# Patient Record
Sex: Female | Born: 1995 | Race: White | Hispanic: No | Marital: Single | Smoking: Never smoker
Health system: Southern US, Community
[De-identification: ages and names within clinical notes are randomized; demographics above are authoritative.]

## PROBLEM LIST (undated history)

## (undated) DIAGNOSIS — J309 Allergic rhinitis, unspecified: Secondary | ICD-10-CM

## (undated) HISTORY — DX: Allergic rhinitis, unspecified: J30.9

## (undated) HISTORY — PX: CARDIAC SURGERY: SHX584

---
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
## 2016-02-23 ENCOUNTER — Ambulatory Visit (INDEPENDENT_AMBULATORY_CARE_PROVIDER_SITE_OTHER): Payer: No Typology Code available for payment source | Admitting: Allergy and Immunology

## 2016-02-23 ENCOUNTER — Encounter: Payer: Self-pay | Admitting: Allergy and Immunology

## 2016-02-23 VITALS — BP 110/70 | HR 70 | Temp 98.0°F | Resp 16 | Ht 58.27 in | Wt 152.6 lb

## 2016-02-23 DIAGNOSIS — J3089 Other allergic rhinitis: Secondary | ICD-10-CM

## 2016-02-23 DIAGNOSIS — R059 Cough, unspecified: Secondary | ICD-10-CM | POA: Insufficient documentation

## 2016-02-23 DIAGNOSIS — R05 Cough: Secondary | ICD-10-CM

## 2016-02-23 DIAGNOSIS — H1045 Other chronic allergic conjunctivitis: Secondary | ICD-10-CM | POA: Diagnosis not present

## 2016-02-23 DIAGNOSIS — H1010 Acute atopic conjunctivitis, unspecified eye: Secondary | ICD-10-CM | POA: Insufficient documentation

## 2016-02-23 MED ORDER — OLOPATADINE HCL 0.7 % OP SOLN: 1.0000 [drp] | Freq: Every day | OPHTHALMIC | Status: AC | PRN

## 2016-02-23 MED ORDER — EPINEPHRINE 0.3 MG/0.3ML IJ SOAJ: INTRAMUSCULAR | Status: DC

## 2016-02-23 MED ORDER — AZELASTINE-FLUTICASONE 137-50 MCG/ACT NA SUSP: NASAL | Status: DC

## 2016-02-23 MED ORDER — LEVOCETIRIZINE DIHYDROCHLORIDE 5 MG PO TABS: ORAL_TABLET | ORAL | Status: DC

## 2016-02-23 NOTE — Progress Notes (Signed)
New Patient Note  RE: Brenda Figueroa MRN: [id number] DOB: 1996-02-23 Date of Office Visit: 02/23/2016  Referring provider: Kristie Cowman, MD Primary care provider: Kristie Cowman, MD  Chief Complaint: Allergic Rhinitis  and Sinus Problem   History of present illness: HPI Comments: Brenda Figueroa is a 20 y.o. female who presents today for consultation of rhinitis.  She lives at home with her mother and stepfather but is accompanied today by her grandmother who assists with a history.  She has a history of allergic rhinosinusitis which was treated with aeroallergen immunotherapy approximately 10-15 years ago.  It is unclear if she ever made it to maintenance therapy.  She complains of frequent rhinorrhea, nasal congestion, sneezing, postnasal drainage, coughing, sore throat, ocular pruritus, and occasional sinus pressure.  These symptoms occur year round but tend to be more frequent and severe with pollen exposure.  She had one or 2 sinus infections requiring antibiotics over the past year.  She currently takes fluticasone nasal spray, cetirizine, and diphenhydramine without adequate symptom relief.  The cough that she experiences originates as a tickle in the base of her throat.    Assessment and plan: Seasonal and perennial allergic rhinitis  Aeroallergen avoidance measures have been discussed and provided in written form.  A prescription has been provided for levocetirizine, 5 mg daily as needed.  A prescription has been provided for Dymista (azelastine/fluticasone) nasal spray, 1-2 sprays per nostril twice daily as needed. Proper nasal spray technique has been discussed and demonstrated.  I have also recommended nasal saline spray (i.e. Simply Saline) as needed prior to medicated nasal sprays.  The risks and benefits of aeroallergen immunotherapy have been discussed. The patient and her caregiver are motivated to initiate immunotherapy to reduce symptoms and decrease medication  requirement. Informed consent has been signed and allergen vaccine orders have been submitted. Medications will be decreased or discontinued as symptom relief from immunotherapy becomes evident.  Seasonal allergic conjunctivitis  Treatment plan as outlined above for allergic rhinitis.  A prescription has been provided for Pazeo, one drop per eye daily as needed.  Cough The patient's history and physical examination suggest upper airway cough syndrome.  We will aggressively treat postnasal drainage and evaluate results.  Treatment plan as outlined above.  If the coughing persists or progresses despite this plan, we will evaluate further.    Meds ordered this encounter  Medications  . levocetirizine (XYZAL) 5 MG tablet    Sig: TAKE ONE TABLET ONCE DAILY IF NEEDED    Dispense:  30 tablet    Refill:  5  . Azelastine-Fluticasone (DYMISTA) 137-50 MCG/ACT SUSP    Sig: USE 1-2 SPRAYS IN EACH NOSTRIL TWICE DAILY    Dispense:  1 Bottle    Refill:  5  . Olopatadine HCl (PAZEO) 0.7 % SOLN    Sig: Place 1 drop into both eyes daily as needed.    Dispense:  1 Bottle    Refill:  5  . EPINEPHrine (EPIPEN 2-PAK) 0.3 mg/0.3 mL IJ SOAJ injection    Sig: USE AS DIRECTED FOR LIFE THREATENING ALLERGIC REACTIONS    Dispense:  2 Device    Refill:  2    Diagnositics: Epicutaneous testing: Positive to grass pollen, weed pollen, ragweed pollen, tree pollen, molds, cat hair, dust mite antigen. Intradermal testing: Positive to molds and dog epithelia.    Physical examination: Blood pressure 110/70, pulse 70, temperature 98 F (36.7 C), temperature source Oral, resp. rate 16, height 4' 10.27" (1.48 m), weight  152 lb 8.9 oz (69.2 kg).  General: Alert, interactive, in no acute distress. HEENT: TMs pearly gray, turbinates edematous and pale with thick discharge, post-pharynx moderately erythematous. Neck: Supple without lymphadenopathy. Lungs: Clear to auscultation without wheezing, rhonchi or  rales. CV: Normal S1, S2 without murmurs. Abdomen: Nondistended, nontender. Skin: Warm and dry, without lesions or rashes. Extremities:  No clubbing, cyanosis or edema. Neuro:   Grossly intact.  Review of systems:  Review of Systems  Constitutional: Negative for fever, chills and weight loss.  HENT: Positive for congestion. Negative for nosebleeds.   Eyes: Negative for blurred vision.  Respiratory: Positive for cough. Negative for hemoptysis, shortness of breath and wheezing.   Cardiovascular: Negative for chest pain.  Gastrointestinal: Negative for diarrhea and constipation.  Genitourinary: Negative for dysuria.  Musculoskeletal: Negative for myalgias and joint pain.  Skin: Negative for itching and rash.  Neurological: Positive for headaches. Negative for dizziness.  Endo/Heme/Allergies: Positive for environmental allergies. Does not bruise/bleed easily.    Past medical history:  Past Medical History  Diagnosis Date  . Allergic rhinitis 2000    Past surgical history:  Past Surgical History  Procedure Laterality Date  . Cardiac surgery Bilateral     Family history: Family History  Problem Relation Age of Onset  . Allergic rhinitis Mother   . Allergic rhinitis Maternal Grandmother   . Angioedema Neg Hx   . Asthma Neg Hx   . Atopy Neg Hx   . Eczema Neg Hx   . Immunodeficiency Neg Hx   . Urticaria Neg Hx     Social history: Social History   Social History  . Marital Status: Single    Spouse Name: N/A  . Number of Children: N/A  . Years of Education: N/A   Occupational History  . Not on file.   Social History Main Topics  . Smoking status: Never Smoker   . Smokeless tobacco: Not on file  . Alcohol Use: No  . Drug Use: No  . Sexual Activity: Not on file   Other Topics Concern  . Not on file   Social History Narrative  . No narrative on file   Environmental History: he patient lives in a carpeting in the bedroom, gas heat, and central air.  There are  dogs in the house which have access to her bedroom. She is a non-smoker and not exposed to significant secondhand smoke.    Medication List       This list is accurate as of: 02/23/16  1:19 PM.  Always use your most recent med list.               Azelastine-Fluticasone 137-50 MCG/ACT Susp  Commonly known as:  DYMISTA  USE 1-2 SPRAYS IN EACH NOSTRIL TWICE DAILY     cetirizine 10 MG tablet  Commonly known as:  ZYRTEC  Take by mouth.     EPINEPHrine 0.3 mg/0.3 mL Soaj injection  Commonly known as:  EPIPEN 2-PAK  USE AS DIRECTED FOR LIFE THREATENING ALLERGIC REACTIONS     fluticasone 50 MCG/ACT nasal spray  Commonly known as:  FLONASE     fluticasone 50 MCG/ACT nasal spray  Commonly known as:  FLONASE  Place 2 sprays into both nostrils daily. Reported on 02/23/2016     levocetirizine 5 MG tablet  Commonly known as:  XYZAL  TAKE ONE TABLET ONCE DAILY IF NEEDED     levothyroxine 100 MCG tablet  Commonly known as:  SYNTHROID, LEVOTHROID  Reported on 02/23/2016  levothyroxine 100 MCG tablet  Commonly known as:  SYNTHROID, LEVOTHROID     multivitamin capsule  Take by mouth.     multivitamin with minerals tablet  Take by mouth. Reported on 02/23/2016     Olopatadine HCl 0.7 % Soln  Commonly known as:  PAZEO  Place 1 drop into both eyes daily as needed.     pyridOXINE 25 MG tablet  Commonly known as:  VITAMIN B-6  Take by mouth. Reported on 02/23/2016     sodium chloride 0.65 % nasal spray  Commonly known as:  OCEAN  Place into the nose.        Known medication allergies: Allergies  Allergen Reactions  . Doxycycline Rash    I appreciate the opportunity to take part in this Shadie's care. Please do not hesitate to contact me with questions.  Sincerely,   R. Jorene Guest, MD

## 2016-02-23 NOTE — Assessment & Plan Note (Signed)
   Aeroallergen avoidance measures have been discussed and provided in written form.  A prescription has been provided for levocetirizine, 5 mg daily as needed.  A prescription has been provided for Dymista (azelastine/fluticasone) nasal spray, 1-2 sprays per nostril twice daily as needed. Proper nasal spray technique has been discussed and demonstrated.  I have also recommended nasal saline spray (i.e. Simply Saline) as needed prior to medicated nasal sprays.  The risks and benefits of aeroallergen immunotherapy have been discussed. The patient and her caregiver are motivated to initiate immunotherapy to reduce symptoms and decrease medication requirement. Informed consent has been signed and allergen vaccine orders have been submitted. Medications will be decreased or discontinued as symptom relief from immunotherapy becomes evident.

## 2016-02-23 NOTE — Assessment & Plan Note (Signed)
   Treatment plan as outlined above for allergic rhinitis.  A prescription has been provided for Pazeo, one drop per eye daily as needed. 

## 2016-02-23 NOTE — Assessment & Plan Note (Signed)
The patient's history and physical examination suggest upper airway cough syndrome. We will aggressively treat postnasal drainage and evaluate results.  Treatment plan as outlined above.  If the coughing persists or progresses despite this plan, we will evaluate further. 

## 2016-02-23 NOTE — Patient Instructions (Addendum)
Seasonal and perennial allergic rhinitis  Aeroallergen avoidance measures have been discussed and provided in written form.  A prescription has been provided for levocetirizine, 5 mg daily as needed.  A prescription has been provided for Dymista (azelastine/fluticasone) nasal spray, 1-2 sprays per nostril twice daily as needed. Proper nasal spray technique has been discussed and demonstrated.  I have also recommended nasal saline spray (i.e. Simply Saline) as needed prior to medicated nasal sprays.  The risks and benefits of aeroallergen immunotherapy have been discussed. The patient and her caregiver are motivated to initiate immunotherapy to reduce symptoms and decrease medication requirement. Informed consent has been signed and allergen vaccine orders have been submitted. Medications will be decreased or discontinued as symptom relief from immunotherapy becomes evident.  Seasonal allergic conjunctivitis  Treatment plan as outlined above for allergic rhinitis.  A prescription has been provided for Pazeo, one drop per eye daily as needed.  Cough The patient's history and physical examination suggest upper airway cough syndrome.  We will aggressively treat postnasal drainage and evaluate results.  Treatment plan as outlined above.  If the coughing persists or progresses despite this plan, we will evaluate further.    Return in about 4 months (around 06/24/2016), or if symptoms worsen or fail to improve.  Reducing Pollen Exposure  The American Academy of Allergy, Asthma and Immunology suggests the following steps to reduce your exposure to pollen during allergy seasons.    1. Do not hang sheets or clothing out to dry; pollen may collect on these items. 2. Do not mow lawns or spend time around freshly cut grass; mowing stirs up pollen. 3. Keep windows closed at night.  Keep car windows closed while driving. 4. Minimize morning activities outdoors, a time when pollen counts are  usually at their highest. 5. Stay indoors as much as possible when pollen counts or humidity is high and on windy days when pollen tends to remain in the air longer. 6. Use air conditioning when possible.  Many air conditioners have filters that trap the pollen spores. 7. Use a HEPA room air filter to remove pollen form the indoor air you breathe.   Control of Dog or Cat Allergen  Avoidance is the best way to manage a dog or cat allergy. If you have a dog or cat and are allergic to dog or cats, consider removing the dog or cat from the home. If you have a dog or cat but don't want to find it a new home, or if your family wants a pet even though someone in the household is allergic, here are some strategies that may help keep symptoms at bay:  1. Keep the pet out of your bedroom and restrict it to only a few rooms. Be advised that keeping the dog or cat in only one room will not limit the allergens to that room. 2. Don't pet, hug or kiss the dog or cat; if you do, wash your hands with soap and water. 3. High-efficiency particulate air (HEPA) cleaners run continuously in a bedroom or living room can reduce allergen levels over time. 4. Regular use of a high-efficiency vacuum cleaner or a central vacuum can reduce allergen levels. 5. Giving your dog or cat a bath at least once a week can reduce airborne allergen.  Control of House Dust Mite Allergen  House dust mites play a major role in allergic asthma and rhinitis.  They occur in environments with high humidity wherever human skin, the food for dust mites  is found. High levels have been detected in dust obtained from mattresses, pillows, carpets, upholstered furniture, bed covers, clothes and soft toys.  The principal allergen of the house dust mite is found in its feces.  A gram of dust may contain 1,000 mites and 250,000 fecal particles.  Mite antigen is easily measured in the air during house cleaning activities.    1. Encase mattresses,  including the box spring, and pillow, in an air tight cover.  Seal the zipper end of the encased mattresses with wide adhesive tape. 2. Wash the bedding in water of 130 degrees Farenheit weekly.  Avoid cotton comforters/quilts and flannel bedding: the most ideal bed covering is the dacron comforter. 3. Remove all upholstered furniture from the bedroom. 4. Remove carpets, carpet padding, rugs, and non-washable window drapes from the bedroom.  Wash drapes weekly or use plastic window coverings. 5. Remove all non-washable stuffed toys from the bedroom.  Wash stuffed toys weekly. 6. Have the room cleaned frequently with a vacuum cleaner and a damp dust-mop.  The patient should not be in a room which is being cleaned and should wait 1 hour after cleaning before going into the room. 7. Close and seal all heating outlets in the bedroom.  Otherwise, the room will become filled with dust-laden air.  An electric heater can be used to heat the room. 8. Reduce indoor humidity to less than 50%.  Do not use a humidifier.  Control of Mold Allergen  Mold and fungi can grow on a variety of surfaces provided certain temperature and moisture conditions exist.  Outdoor molds grow on plants, decaying vegetation and soil.  The major outdoor mold, Alternaria and Cladosporium, are found in very high numbers during hot and dry conditions.  Generally, a late Summer - Fall peak is seen for common outdoor fungal spores.  Rain will temporarily lower outdoor mold spore count, but counts rise rapidly when the rainy period ends.  The most important indoor molds are Aspergillus and Penicillium.  Dark, humid and poorly ventilated basements are ideal sites for mold growth.  The next most common sites of mold growth are the bathroom and the kitchen.  Outdoor [employer] 1. Use air conditioning and keep windows closed 2. Avoid exposure to decaying vegetation. 3. Avoid leaf raking. 4. Avoid grain handling. 5. Consider wearing a face  mask if working in moldy areas.  Indoor Mold Control 1. Maintain humidity below 50%. 2. Clean washable surfaces with 5% bleach solution. 3. Remove sources e.g. Contaminated carpets.

## 2016-02-25 DIAGNOSIS — J301 Allergic rhinitis due to pollen: Secondary | ICD-10-CM | POA: Diagnosis not present

## 2016-02-26 DIAGNOSIS — J3089 Other allergic rhinitis: Secondary | ICD-10-CM | POA: Diagnosis not present

## 2016-03-02 ENCOUNTER — Encounter: Payer: Self-pay | Admitting: *Deleted

## 2016-03-22 ENCOUNTER — Encounter: Payer: Self-pay | Admitting: *Deleted

## 2016-03-22 ENCOUNTER — Ambulatory Visit (INDEPENDENT_AMBULATORY_CARE_PROVIDER_SITE_OTHER): Payer: No Typology Code available for payment source | Admitting: *Deleted

## 2016-03-22 DIAGNOSIS — J309 Allergic rhinitis, unspecified: Secondary | ICD-10-CM

## 2016-03-22 NOTE — Progress Notes (Signed)
PATIENT STARTED ALLERGY INJS SCH A 1-2 TIMES WEEKLY REVIEWED SCHEDULE, HOURS, SIDE EFFECTS WITH GRANDMOTHER AND PATIENT

## 2016-03-29 ENCOUNTER — Ambulatory Visit (INDEPENDENT_AMBULATORY_CARE_PROVIDER_SITE_OTHER): Payer: No Typology Code available for payment source

## 2016-03-29 DIAGNOSIS — J309 Allergic rhinitis, unspecified: Secondary | ICD-10-CM

## 2016-03-31 ENCOUNTER — Ambulatory Visit (INDEPENDENT_AMBULATORY_CARE_PROVIDER_SITE_OTHER): Payer: No Typology Code available for payment source

## 2016-03-31 DIAGNOSIS — J309 Allergic rhinitis, unspecified: Secondary | ICD-10-CM

## 2016-04-05 ENCOUNTER — Ambulatory Visit (INDEPENDENT_AMBULATORY_CARE_PROVIDER_SITE_OTHER): Payer: No Typology Code available for payment source

## 2016-04-05 DIAGNOSIS — J309 Allergic rhinitis, unspecified: Secondary | ICD-10-CM | POA: Diagnosis not present

## 2016-04-07 ENCOUNTER — Ambulatory Visit (INDEPENDENT_AMBULATORY_CARE_PROVIDER_SITE_OTHER): Payer: No Typology Code available for payment source

## 2016-04-07 DIAGNOSIS — J309 Allergic rhinitis, unspecified: Secondary | ICD-10-CM

## 2016-04-11 ENCOUNTER — Ambulatory Visit (INDEPENDENT_AMBULATORY_CARE_PROVIDER_SITE_OTHER): Payer: No Typology Code available for payment source | Admitting: *Deleted

## 2016-04-11 DIAGNOSIS — J309 Allergic rhinitis, unspecified: Secondary | ICD-10-CM

## 2016-04-13 ENCOUNTER — Ambulatory Visit (INDEPENDENT_AMBULATORY_CARE_PROVIDER_SITE_OTHER): Payer: No Typology Code available for payment source

## 2016-04-13 DIAGNOSIS — J309 Allergic rhinitis, unspecified: Secondary | ICD-10-CM | POA: Diagnosis not present

## 2016-04-19 ENCOUNTER — Ambulatory Visit (INDEPENDENT_AMBULATORY_CARE_PROVIDER_SITE_OTHER): Payer: No Typology Code available for payment source

## 2016-04-19 DIAGNOSIS — J309 Allergic rhinitis, unspecified: Secondary | ICD-10-CM | POA: Diagnosis not present

## 2016-04-21 ENCOUNTER — Ambulatory Visit (INDEPENDENT_AMBULATORY_CARE_PROVIDER_SITE_OTHER): Payer: No Typology Code available for payment source

## 2016-04-21 DIAGNOSIS — J309 Allergic rhinitis, unspecified: Secondary | ICD-10-CM

## 2016-04-26 ENCOUNTER — Ambulatory Visit (INDEPENDENT_AMBULATORY_CARE_PROVIDER_SITE_OTHER): Payer: No Typology Code available for payment source

## 2016-04-26 DIAGNOSIS — J309 Allergic rhinitis, unspecified: Secondary | ICD-10-CM | POA: Diagnosis not present

## 2016-04-28 ENCOUNTER — Ambulatory Visit (INDEPENDENT_AMBULATORY_CARE_PROVIDER_SITE_OTHER): Payer: No Typology Code available for payment source

## 2016-04-28 DIAGNOSIS — J309 Allergic rhinitis, unspecified: Secondary | ICD-10-CM | POA: Diagnosis not present

## 2016-05-03 ENCOUNTER — Ambulatory Visit (INDEPENDENT_AMBULATORY_CARE_PROVIDER_SITE_OTHER): Payer: No Typology Code available for payment source

## 2016-05-03 DIAGNOSIS — J309 Allergic rhinitis, unspecified: Secondary | ICD-10-CM | POA: Diagnosis not present

## 2016-05-05 ENCOUNTER — Ambulatory Visit (INDEPENDENT_AMBULATORY_CARE_PROVIDER_SITE_OTHER): Payer: No Typology Code available for payment source

## 2016-05-05 DIAGNOSIS — J309 Allergic rhinitis, unspecified: Secondary | ICD-10-CM

## 2016-05-10 ENCOUNTER — Ambulatory Visit (INDEPENDENT_AMBULATORY_CARE_PROVIDER_SITE_OTHER): Payer: No Typology Code available for payment source

## 2016-05-10 DIAGNOSIS — J309 Allergic rhinitis, unspecified: Secondary | ICD-10-CM | POA: Diagnosis not present

## 2016-05-12 ENCOUNTER — Ambulatory Visit (INDEPENDENT_AMBULATORY_CARE_PROVIDER_SITE_OTHER): Payer: No Typology Code available for payment source

## 2016-05-12 DIAGNOSIS — J309 Allergic rhinitis, unspecified: Secondary | ICD-10-CM

## 2016-05-17 ENCOUNTER — Ambulatory Visit (INDEPENDENT_AMBULATORY_CARE_PROVIDER_SITE_OTHER): Payer: No Typology Code available for payment source | Admitting: *Deleted

## 2016-05-17 DIAGNOSIS — J309 Allergic rhinitis, unspecified: Secondary | ICD-10-CM | POA: Diagnosis not present

## 2016-05-19 ENCOUNTER — Ambulatory Visit (INDEPENDENT_AMBULATORY_CARE_PROVIDER_SITE_OTHER): Payer: Medicaid Other

## 2016-05-19 DIAGNOSIS — J309 Allergic rhinitis, unspecified: Secondary | ICD-10-CM | POA: Diagnosis not present

## 2016-05-24 ENCOUNTER — Ambulatory Visit (INDEPENDENT_AMBULATORY_CARE_PROVIDER_SITE_OTHER): Payer: Medicaid Other

## 2016-05-24 DIAGNOSIS — J309 Allergic rhinitis, unspecified: Secondary | ICD-10-CM

## 2016-05-26 ENCOUNTER — Ambulatory Visit (INDEPENDENT_AMBULATORY_CARE_PROVIDER_SITE_OTHER): Payer: Medicaid Other

## 2016-05-26 DIAGNOSIS — J309 Allergic rhinitis, unspecified: Secondary | ICD-10-CM

## 2016-05-31 ENCOUNTER — Ambulatory Visit (INDEPENDENT_AMBULATORY_CARE_PROVIDER_SITE_OTHER): Payer: Medicaid Other | Admitting: *Deleted

## 2016-05-31 DIAGNOSIS — J309 Allergic rhinitis, unspecified: Secondary | ICD-10-CM

## 2016-06-03 ENCOUNTER — Ambulatory Visit (INDEPENDENT_AMBULATORY_CARE_PROVIDER_SITE_OTHER): Payer: Medicaid Other | Admitting: *Deleted

## 2016-06-03 DIAGNOSIS — J309 Allergic rhinitis, unspecified: Secondary | ICD-10-CM | POA: Diagnosis not present

## 2016-06-10 ENCOUNTER — Ambulatory Visit (INDEPENDENT_AMBULATORY_CARE_PROVIDER_SITE_OTHER): Payer: Medicaid Other | Admitting: *Deleted

## 2016-06-10 DIAGNOSIS — J309 Allergic rhinitis, unspecified: Secondary | ICD-10-CM

## 2016-06-14 ENCOUNTER — Ambulatory Visit (INDEPENDENT_AMBULATORY_CARE_PROVIDER_SITE_OTHER): Payer: Medicaid Other

## 2016-06-14 DIAGNOSIS — J309 Allergic rhinitis, unspecified: Secondary | ICD-10-CM | POA: Diagnosis not present

## 2016-06-16 ENCOUNTER — Ambulatory Visit: Payer: Medicaid Other | Admitting: Allergy and Immunology

## 2016-06-21 ENCOUNTER — Ambulatory Visit (INDEPENDENT_AMBULATORY_CARE_PROVIDER_SITE_OTHER): Payer: Medicaid Other | Admitting: *Deleted

## 2016-06-21 DIAGNOSIS — J309 Allergic rhinitis, unspecified: Secondary | ICD-10-CM

## 2016-06-28 ENCOUNTER — Ambulatory Visit (INDEPENDENT_AMBULATORY_CARE_PROVIDER_SITE_OTHER): Payer: Medicaid Other

## 2016-06-28 DIAGNOSIS — J309 Allergic rhinitis, unspecified: Secondary | ICD-10-CM | POA: Diagnosis not present

## 2016-07-04 ENCOUNTER — Ambulatory Visit (INDEPENDENT_AMBULATORY_CARE_PROVIDER_SITE_OTHER): Payer: Medicaid Other

## 2016-07-04 DIAGNOSIS — J309 Allergic rhinitis, unspecified: Secondary | ICD-10-CM

## 2016-07-19 ENCOUNTER — Ambulatory Visit (INDEPENDENT_AMBULATORY_CARE_PROVIDER_SITE_OTHER): Payer: Medicaid Other | Admitting: *Deleted

## 2016-07-19 DIAGNOSIS — J309 Allergic rhinitis, unspecified: Secondary | ICD-10-CM

## 2016-07-27 ENCOUNTER — Ambulatory Visit (INDEPENDENT_AMBULATORY_CARE_PROVIDER_SITE_OTHER): Payer: Managed Care, Other (non HMO) | Admitting: *Deleted

## 2016-07-27 DIAGNOSIS — J309 Allergic rhinitis, unspecified: Secondary | ICD-10-CM | POA: Diagnosis not present

## 2016-08-04 ENCOUNTER — Ambulatory Visit (INDEPENDENT_AMBULATORY_CARE_PROVIDER_SITE_OTHER): Payer: Managed Care, Other (non HMO) | Admitting: *Deleted

## 2016-08-04 DIAGNOSIS — J309 Allergic rhinitis, unspecified: Secondary | ICD-10-CM | POA: Diagnosis not present

## 2016-08-09 ENCOUNTER — Ambulatory Visit (INDEPENDENT_AMBULATORY_CARE_PROVIDER_SITE_OTHER): Payer: Medicaid Other

## 2016-08-09 DIAGNOSIS — J309 Allergic rhinitis, unspecified: Secondary | ICD-10-CM

## 2016-08-16 ENCOUNTER — Ambulatory Visit (INDEPENDENT_AMBULATORY_CARE_PROVIDER_SITE_OTHER): Payer: Managed Care, Other (non HMO) | Admitting: *Deleted

## 2016-08-16 DIAGNOSIS — J309 Allergic rhinitis, unspecified: Secondary | ICD-10-CM

## 2016-08-25 ENCOUNTER — Ambulatory Visit (INDEPENDENT_AMBULATORY_CARE_PROVIDER_SITE_OTHER): Payer: Managed Care, Other (non HMO)

## 2016-08-25 DIAGNOSIS — J309 Allergic rhinitis, unspecified: Secondary | ICD-10-CM | POA: Diagnosis not present

## 2016-09-07 ENCOUNTER — Ambulatory Visit (INDEPENDENT_AMBULATORY_CARE_PROVIDER_SITE_OTHER): Payer: Managed Care, Other (non HMO)

## 2016-09-07 DIAGNOSIS — J309 Allergic rhinitis, unspecified: Secondary | ICD-10-CM

## 2016-09-13 ENCOUNTER — Other Ambulatory Visit: Payer: Self-pay | Admitting: Allergy and Immunology

## 2016-09-13 DIAGNOSIS — J3089 Other allergic rhinitis: Secondary | ICD-10-CM

## 2016-09-14 ENCOUNTER — Other Ambulatory Visit: Payer: Self-pay | Admitting: Allergy and Immunology

## 2016-09-14 DIAGNOSIS — J3089 Other allergic rhinitis: Secondary | ICD-10-CM

## 2016-09-15 ENCOUNTER — Ambulatory Visit (INDEPENDENT_AMBULATORY_CARE_PROVIDER_SITE_OTHER): Payer: Managed Care, Other (non HMO) | Admitting: *Deleted

## 2016-09-15 DIAGNOSIS — J309 Allergic rhinitis, unspecified: Secondary | ICD-10-CM

## 2016-09-15 NOTE — Progress Notes (Signed)
Refill on Xyzal x 5

## 2016-09-22 ENCOUNTER — Ambulatory Visit (INDEPENDENT_AMBULATORY_CARE_PROVIDER_SITE_OTHER): Payer: Managed Care, Other (non HMO) | Admitting: *Deleted

## 2016-09-22 DIAGNOSIS — J309 Allergic rhinitis, unspecified: Secondary | ICD-10-CM

## 2016-09-27 ENCOUNTER — Ambulatory Visit (INDEPENDENT_AMBULATORY_CARE_PROVIDER_SITE_OTHER): Payer: Managed Care, Other (non HMO)

## 2016-09-27 DIAGNOSIS — J309 Allergic rhinitis, unspecified: Secondary | ICD-10-CM | POA: Diagnosis not present

## 2016-10-03 ENCOUNTER — Ambulatory Visit (INDEPENDENT_AMBULATORY_CARE_PROVIDER_SITE_OTHER): Payer: Managed Care, Other (non HMO)

## 2016-10-03 DIAGNOSIS — J309 Allergic rhinitis, unspecified: Secondary | ICD-10-CM

## 2016-10-14 ENCOUNTER — Ambulatory Visit (INDEPENDENT_AMBULATORY_CARE_PROVIDER_SITE_OTHER): Payer: Managed Care, Other (non HMO) | Admitting: *Deleted

## 2016-10-14 DIAGNOSIS — J309 Allergic rhinitis, unspecified: Secondary | ICD-10-CM

## 2016-10-20 ENCOUNTER — Telehealth: Payer: Self-pay | Admitting: Allergy and Immunology

## 2016-10-20 ENCOUNTER — Ambulatory Visit (INDEPENDENT_AMBULATORY_CARE_PROVIDER_SITE_OTHER): Payer: Managed Care, Other (non HMO) | Admitting: *Deleted

## 2016-10-20 DIAGNOSIS — J309 Allergic rhinitis, unspecified: Secondary | ICD-10-CM | POA: Diagnosis not present

## 2016-10-20 DIAGNOSIS — J3089 Other allergic rhinitis: Secondary | ICD-10-CM

## 2016-10-20 MED ORDER — LEVOCETIRIZINE DIHYDROCHLORIDE 5 MG PO TABS: 5.0000 mg | ORAL_TABLET | Freq: Every evening | ORAL | 2 refills | Status: DC

## 2016-10-20 NOTE — Telephone Encounter (Signed)
Script sent into pharmacy 

## 2016-10-20 NOTE — Telephone Encounter (Signed)
Need a refill of levocetirizine 5mg . Called into [facility] on [address] .[phone].

## 2016-11-03 ENCOUNTER — Ambulatory Visit (INDEPENDENT_AMBULATORY_CARE_PROVIDER_SITE_OTHER): Payer: Managed Care, Other (non HMO) | Admitting: *Deleted

## 2016-11-03 DIAGNOSIS — J309 Allergic rhinitis, unspecified: Secondary | ICD-10-CM | POA: Diagnosis not present

## 2016-11-06 ENCOUNTER — Other Ambulatory Visit: Payer: Self-pay | Admitting: Allergy and Immunology

## 2016-11-06 DIAGNOSIS — J3089 Other allergic rhinitis: Secondary | ICD-10-CM

## 2016-11-14 DIAGNOSIS — J3089 Other allergic rhinitis: Secondary | ICD-10-CM

## 2016-11-15 DIAGNOSIS — J301 Allergic rhinitis due to pollen: Secondary | ICD-10-CM | POA: Diagnosis not present

## 2016-11-22 ENCOUNTER — Ambulatory Visit (INDEPENDENT_AMBULATORY_CARE_PROVIDER_SITE_OTHER): Payer: Managed Care, Other (non HMO) | Admitting: *Deleted

## 2016-11-22 DIAGNOSIS — J309 Allergic rhinitis, unspecified: Secondary | ICD-10-CM

## 2016-12-15 ENCOUNTER — Ambulatory Visit (INDEPENDENT_AMBULATORY_CARE_PROVIDER_SITE_OTHER): Payer: BLUE CROSS/BLUE SHIELD

## 2016-12-15 DIAGNOSIS — J309 Allergic rhinitis, unspecified: Secondary | ICD-10-CM | POA: Diagnosis not present

## 2016-12-27 ENCOUNTER — Other Ambulatory Visit: Payer: Self-pay | Admitting: Allergy and Immunology

## 2016-12-27 DIAGNOSIS — J3089 Other allergic rhinitis: Secondary | ICD-10-CM

## 2017-01-05 ENCOUNTER — Ambulatory Visit (INDEPENDENT_AMBULATORY_CARE_PROVIDER_SITE_OTHER): Payer: BLUE CROSS/BLUE SHIELD | Admitting: *Deleted

## 2017-01-05 DIAGNOSIS — J309 Allergic rhinitis, unspecified: Secondary | ICD-10-CM | POA: Diagnosis not present

## 2017-01-12 ENCOUNTER — Ambulatory Visit (INDEPENDENT_AMBULATORY_CARE_PROVIDER_SITE_OTHER): Payer: BLUE CROSS/BLUE SHIELD | Admitting: *Deleted

## 2017-01-12 DIAGNOSIS — J309 Allergic rhinitis, unspecified: Secondary | ICD-10-CM

## 2017-01-19 ENCOUNTER — Ambulatory Visit (INDEPENDENT_AMBULATORY_CARE_PROVIDER_SITE_OTHER): Payer: BLUE CROSS/BLUE SHIELD | Admitting: *Deleted

## 2017-01-19 DIAGNOSIS — J309 Allergic rhinitis, unspecified: Secondary | ICD-10-CM

## 2017-01-26 ENCOUNTER — Ambulatory Visit (INDEPENDENT_AMBULATORY_CARE_PROVIDER_SITE_OTHER): Payer: BLUE CROSS/BLUE SHIELD | Admitting: *Deleted

## 2017-01-26 DIAGNOSIS — J309 Allergic rhinitis, unspecified: Secondary | ICD-10-CM | POA: Diagnosis not present

## 2017-02-09 ENCOUNTER — Ambulatory Visit (INDEPENDENT_AMBULATORY_CARE_PROVIDER_SITE_OTHER): Payer: BLUE CROSS/BLUE SHIELD

## 2017-02-09 DIAGNOSIS — J309 Allergic rhinitis, unspecified: Secondary | ICD-10-CM | POA: Diagnosis not present

## 2017-02-23 ENCOUNTER — Ambulatory Visit (INDEPENDENT_AMBULATORY_CARE_PROVIDER_SITE_OTHER): Payer: BLUE CROSS/BLUE SHIELD | Admitting: *Deleted

## 2017-02-23 DIAGNOSIS — J309 Allergic rhinitis, unspecified: Secondary | ICD-10-CM

## 2017-03-02 ENCOUNTER — Ambulatory Visit (INDEPENDENT_AMBULATORY_CARE_PROVIDER_SITE_OTHER): Payer: BLUE CROSS/BLUE SHIELD | Admitting: *Deleted

## 2017-03-02 DIAGNOSIS — J309 Allergic rhinitis, unspecified: Secondary | ICD-10-CM | POA: Diagnosis not present

## 2017-03-09 ENCOUNTER — Ambulatory Visit (INDEPENDENT_AMBULATORY_CARE_PROVIDER_SITE_OTHER): Payer: BLUE CROSS/BLUE SHIELD | Admitting: *Deleted

## 2017-03-09 DIAGNOSIS — J309 Allergic rhinitis, unspecified: Secondary | ICD-10-CM | POA: Diagnosis not present

## 2017-03-16 ENCOUNTER — Ambulatory Visit (INDEPENDENT_AMBULATORY_CARE_PROVIDER_SITE_OTHER): Payer: BLUE CROSS/BLUE SHIELD | Admitting: *Deleted

## 2017-03-16 DIAGNOSIS — J309 Allergic rhinitis, unspecified: Secondary | ICD-10-CM

## 2017-03-23 ENCOUNTER — Ambulatory Visit (INDEPENDENT_AMBULATORY_CARE_PROVIDER_SITE_OTHER): Payer: BLUE CROSS/BLUE SHIELD

## 2017-03-23 DIAGNOSIS — J3089 Other allergic rhinitis: Secondary | ICD-10-CM

## 2017-03-30 ENCOUNTER — Ambulatory Visit (INDEPENDENT_AMBULATORY_CARE_PROVIDER_SITE_OTHER): Payer: BLUE CROSS/BLUE SHIELD | Admitting: *Deleted

## 2017-03-30 DIAGNOSIS — J309 Allergic rhinitis, unspecified: Secondary | ICD-10-CM | POA: Diagnosis not present

## 2017-04-06 ENCOUNTER — Ambulatory Visit (INDEPENDENT_AMBULATORY_CARE_PROVIDER_SITE_OTHER): Payer: BLUE CROSS/BLUE SHIELD | Admitting: *Deleted

## 2017-04-06 DIAGNOSIS — J309 Allergic rhinitis, unspecified: Secondary | ICD-10-CM

## 2017-04-27 ENCOUNTER — Ambulatory Visit (INDEPENDENT_AMBULATORY_CARE_PROVIDER_SITE_OTHER): Payer: BLUE CROSS/BLUE SHIELD | Admitting: *Deleted

## 2017-04-27 DIAGNOSIS — J309 Allergic rhinitis, unspecified: Secondary | ICD-10-CM

## 2017-05-04 ENCOUNTER — Ambulatory Visit (INDEPENDENT_AMBULATORY_CARE_PROVIDER_SITE_OTHER): Payer: BLUE CROSS/BLUE SHIELD | Admitting: *Deleted

## 2017-05-04 DIAGNOSIS — J309 Allergic rhinitis, unspecified: Secondary | ICD-10-CM | POA: Diagnosis not present

## 2017-05-11 ENCOUNTER — Ambulatory Visit (INDEPENDENT_AMBULATORY_CARE_PROVIDER_SITE_OTHER): Payer: BLUE CROSS/BLUE SHIELD | Admitting: *Deleted

## 2017-05-11 DIAGNOSIS — J309 Allergic rhinitis, unspecified: Secondary | ICD-10-CM

## 2017-05-23 NOTE — Progress Notes (Signed)
VIALS EXP 05-26-18  HC/JM 

## 2017-05-26 DIAGNOSIS — J301 Allergic rhinitis due to pollen: Secondary | ICD-10-CM | POA: Diagnosis not present

## 2017-06-01 ENCOUNTER — Ambulatory Visit (INDEPENDENT_AMBULATORY_CARE_PROVIDER_SITE_OTHER): Payer: BLUE CROSS/BLUE SHIELD | Admitting: *Deleted

## 2017-06-01 DIAGNOSIS — J309 Allergic rhinitis, unspecified: Secondary | ICD-10-CM

## 2017-06-08 ENCOUNTER — Ambulatory Visit (INDEPENDENT_AMBULATORY_CARE_PROVIDER_SITE_OTHER): Payer: BLUE CROSS/BLUE SHIELD | Admitting: *Deleted

## 2017-06-08 DIAGNOSIS — J309 Allergic rhinitis, unspecified: Secondary | ICD-10-CM | POA: Diagnosis not present

## 2017-06-16 ENCOUNTER — Ambulatory Visit (INDEPENDENT_AMBULATORY_CARE_PROVIDER_SITE_OTHER): Payer: BLUE CROSS/BLUE SHIELD

## 2017-06-16 DIAGNOSIS — J309 Allergic rhinitis, unspecified: Secondary | ICD-10-CM

## 2017-06-22 ENCOUNTER — Ambulatory Visit (INDEPENDENT_AMBULATORY_CARE_PROVIDER_SITE_OTHER): Payer: BLUE CROSS/BLUE SHIELD | Admitting: *Deleted

## 2017-06-22 DIAGNOSIS — J309 Allergic rhinitis, unspecified: Secondary | ICD-10-CM

## 2017-07-05 ENCOUNTER — Other Ambulatory Visit: Payer: Self-pay | Admitting: Allergy and Immunology

## 2017-07-05 DIAGNOSIS — J3089 Other allergic rhinitis: Secondary | ICD-10-CM

## 2017-07-06 ENCOUNTER — Ambulatory Visit (INDEPENDENT_AMBULATORY_CARE_PROVIDER_SITE_OTHER): Payer: BLUE CROSS/BLUE SHIELD | Admitting: *Deleted

## 2017-07-06 DIAGNOSIS — J309 Allergic rhinitis, unspecified: Secondary | ICD-10-CM

## 2017-07-18 ENCOUNTER — Ambulatory Visit (INDEPENDENT_AMBULATORY_CARE_PROVIDER_SITE_OTHER): Payer: BLUE CROSS/BLUE SHIELD | Admitting: *Deleted

## 2017-07-18 DIAGNOSIS — J309 Allergic rhinitis, unspecified: Secondary | ICD-10-CM | POA: Diagnosis not present

## 2017-07-25 ENCOUNTER — Ambulatory Visit (INDEPENDENT_AMBULATORY_CARE_PROVIDER_SITE_OTHER): Payer: BLUE CROSS/BLUE SHIELD | Admitting: *Deleted

## 2017-07-25 DIAGNOSIS — J309 Allergic rhinitis, unspecified: Secondary | ICD-10-CM

## 2017-08-01 ENCOUNTER — Other Ambulatory Visit: Payer: Self-pay | Admitting: Allergy and Immunology

## 2017-08-01 DIAGNOSIS — J3089 Other allergic rhinitis: Secondary | ICD-10-CM

## 2017-08-03 ENCOUNTER — Ambulatory Visit (INDEPENDENT_AMBULATORY_CARE_PROVIDER_SITE_OTHER): Payer: BLUE CROSS/BLUE SHIELD | Admitting: *Deleted

## 2017-08-03 DIAGNOSIS — J309 Allergic rhinitis, unspecified: Secondary | ICD-10-CM | POA: Diagnosis not present

## 2017-08-08 ENCOUNTER — Ambulatory Visit (INDEPENDENT_AMBULATORY_CARE_PROVIDER_SITE_OTHER): Payer: BLUE CROSS/BLUE SHIELD

## 2017-08-08 DIAGNOSIS — J309 Allergic rhinitis, unspecified: Secondary | ICD-10-CM | POA: Diagnosis not present

## 2017-09-05 ENCOUNTER — Other Ambulatory Visit: Payer: Self-pay | Admitting: Allergy and Immunology

## 2017-09-05 DIAGNOSIS — J3089 Other allergic rhinitis: Secondary | ICD-10-CM

## 2017-09-07 ENCOUNTER — Ambulatory Visit (INDEPENDENT_AMBULATORY_CARE_PROVIDER_SITE_OTHER): Payer: BLUE CROSS/BLUE SHIELD | Admitting: *Deleted

## 2017-09-07 DIAGNOSIS — J309 Allergic rhinitis, unspecified: Secondary | ICD-10-CM

## 2017-10-04 ENCOUNTER — Ambulatory Visit (INDEPENDENT_AMBULATORY_CARE_PROVIDER_SITE_OTHER): Payer: BLUE CROSS/BLUE SHIELD | Admitting: *Deleted

## 2017-10-04 DIAGNOSIS — J309 Allergic rhinitis, unspecified: Secondary | ICD-10-CM | POA: Diagnosis not present

## 2017-10-12 NOTE — Progress Notes (Signed)
VIALS EXP 10-13-18 

## 2017-10-13 DIAGNOSIS — J3089 Other allergic rhinitis: Secondary | ICD-10-CM | POA: Diagnosis not present

## 2017-11-09 ENCOUNTER — Ambulatory Visit (INDEPENDENT_AMBULATORY_CARE_PROVIDER_SITE_OTHER): Payer: BLUE CROSS/BLUE SHIELD | Admitting: *Deleted

## 2017-11-09 DIAGNOSIS — J309 Allergic rhinitis, unspecified: Secondary | ICD-10-CM

## 2017-12-07 ENCOUNTER — Ambulatory Visit (INDEPENDENT_AMBULATORY_CARE_PROVIDER_SITE_OTHER): Payer: BLUE CROSS/BLUE SHIELD | Admitting: *Deleted

## 2017-12-07 DIAGNOSIS — J309 Allergic rhinitis, unspecified: Secondary | ICD-10-CM

## 2017-12-28 ENCOUNTER — Ambulatory Visit (INDEPENDENT_AMBULATORY_CARE_PROVIDER_SITE_OTHER): Payer: BLUE CROSS/BLUE SHIELD | Admitting: *Deleted

## 2017-12-28 DIAGNOSIS — J309 Allergic rhinitis, unspecified: Secondary | ICD-10-CM | POA: Diagnosis not present

## 2018-01-11 ENCOUNTER — Ambulatory Visit (INDEPENDENT_AMBULATORY_CARE_PROVIDER_SITE_OTHER): Payer: BLUE CROSS/BLUE SHIELD | Admitting: *Deleted

## 2018-01-11 DIAGNOSIS — J309 Allergic rhinitis, unspecified: Secondary | ICD-10-CM | POA: Diagnosis not present

## 2018-01-18 ENCOUNTER — Ambulatory Visit (INDEPENDENT_AMBULATORY_CARE_PROVIDER_SITE_OTHER): Payer: BLUE CROSS/BLUE SHIELD | Admitting: *Deleted

## 2018-01-18 DIAGNOSIS — J309 Allergic rhinitis, unspecified: Secondary | ICD-10-CM | POA: Diagnosis not present

## 2018-02-08 ENCOUNTER — Ambulatory Visit (INDEPENDENT_AMBULATORY_CARE_PROVIDER_SITE_OTHER): Payer: BLUE CROSS/BLUE SHIELD | Admitting: *Deleted

## 2018-02-08 DIAGNOSIS — J309 Allergic rhinitis, unspecified: Secondary | ICD-10-CM | POA: Diagnosis not present

## 2018-02-22 ENCOUNTER — Ambulatory Visit (INDEPENDENT_AMBULATORY_CARE_PROVIDER_SITE_OTHER): Payer: BLUE CROSS/BLUE SHIELD

## 2018-02-22 DIAGNOSIS — J309 Allergic rhinitis, unspecified: Secondary | ICD-10-CM | POA: Diagnosis not present

## 2018-03-15 ENCOUNTER — Ambulatory Visit (INDEPENDENT_AMBULATORY_CARE_PROVIDER_SITE_OTHER): Payer: BLUE CROSS/BLUE SHIELD | Admitting: *Deleted

## 2018-03-15 DIAGNOSIS — J309 Allergic rhinitis, unspecified: Secondary | ICD-10-CM | POA: Diagnosis not present

## 2018-04-05 ENCOUNTER — Ambulatory Visit (INDEPENDENT_AMBULATORY_CARE_PROVIDER_SITE_OTHER): Payer: BLUE CROSS/BLUE SHIELD | Admitting: *Deleted

## 2018-04-05 DIAGNOSIS — J309 Allergic rhinitis, unspecified: Secondary | ICD-10-CM | POA: Diagnosis not present

## 2018-04-12 ENCOUNTER — Ambulatory Visit (INDEPENDENT_AMBULATORY_CARE_PROVIDER_SITE_OTHER): Payer: BLUE CROSS/BLUE SHIELD | Admitting: *Deleted

## 2018-04-12 DIAGNOSIS — J309 Allergic rhinitis, unspecified: Secondary | ICD-10-CM | POA: Diagnosis not present

## 2018-04-26 ENCOUNTER — Ambulatory Visit (INDEPENDENT_AMBULATORY_CARE_PROVIDER_SITE_OTHER): Payer: BLUE CROSS/BLUE SHIELD | Admitting: *Deleted

## 2018-04-26 DIAGNOSIS — J309 Allergic rhinitis, unspecified: Secondary | ICD-10-CM | POA: Diagnosis not present

## 2018-05-03 ENCOUNTER — Ambulatory Visit (INDEPENDENT_AMBULATORY_CARE_PROVIDER_SITE_OTHER): Payer: BLUE CROSS/BLUE SHIELD | Admitting: *Deleted

## 2018-05-03 DIAGNOSIS — J309 Allergic rhinitis, unspecified: Secondary | ICD-10-CM | POA: Diagnosis not present

## 2018-05-08 ENCOUNTER — Ambulatory Visit (INDEPENDENT_AMBULATORY_CARE_PROVIDER_SITE_OTHER): Payer: BLUE CROSS/BLUE SHIELD | Admitting: *Deleted

## 2018-05-08 DIAGNOSIS — J309 Allergic rhinitis, unspecified: Secondary | ICD-10-CM

## 2018-05-18 DIAGNOSIS — J301 Allergic rhinitis due to pollen: Secondary | ICD-10-CM | POA: Diagnosis not present

## 2018-05-24 ENCOUNTER — Ambulatory Visit (INDEPENDENT_AMBULATORY_CARE_PROVIDER_SITE_OTHER): Payer: BLUE CROSS/BLUE SHIELD | Admitting: *Deleted

## 2018-05-24 DIAGNOSIS — J309 Allergic rhinitis, unspecified: Secondary | ICD-10-CM | POA: Diagnosis not present

## 2018-05-31 ENCOUNTER — Ambulatory Visit (INDEPENDENT_AMBULATORY_CARE_PROVIDER_SITE_OTHER): Payer: BLUE CROSS/BLUE SHIELD

## 2018-05-31 DIAGNOSIS — J309 Allergic rhinitis, unspecified: Secondary | ICD-10-CM

## 2018-06-13 ENCOUNTER — Ambulatory Visit (INDEPENDENT_AMBULATORY_CARE_PROVIDER_SITE_OTHER): Payer: BLUE CROSS/BLUE SHIELD

## 2018-06-13 DIAGNOSIS — J309 Allergic rhinitis, unspecified: Secondary | ICD-10-CM | POA: Diagnosis not present

## 2018-06-21 ENCOUNTER — Ambulatory Visit (INDEPENDENT_AMBULATORY_CARE_PROVIDER_SITE_OTHER): Payer: BLUE CROSS/BLUE SHIELD | Admitting: *Deleted

## 2018-06-21 DIAGNOSIS — J309 Allergic rhinitis, unspecified: Secondary | ICD-10-CM

## 2018-07-13 ENCOUNTER — Ambulatory Visit (INDEPENDENT_AMBULATORY_CARE_PROVIDER_SITE_OTHER): Payer: BLUE CROSS/BLUE SHIELD

## 2018-07-13 DIAGNOSIS — J309 Allergic rhinitis, unspecified: Secondary | ICD-10-CM

## 2018-07-20 ENCOUNTER — Ambulatory Visit (INDEPENDENT_AMBULATORY_CARE_PROVIDER_SITE_OTHER): Payer: BLUE CROSS/BLUE SHIELD

## 2018-07-20 DIAGNOSIS — J309 Allergic rhinitis, unspecified: Secondary | ICD-10-CM | POA: Diagnosis not present

## 2018-07-24 ENCOUNTER — Ambulatory Visit (INDEPENDENT_AMBULATORY_CARE_PROVIDER_SITE_OTHER): Payer: BLUE CROSS/BLUE SHIELD | Admitting: *Deleted

## 2018-07-24 DIAGNOSIS — J309 Allergic rhinitis, unspecified: Secondary | ICD-10-CM | POA: Diagnosis not present

## 2018-08-02 ENCOUNTER — Ambulatory Visit (INDEPENDENT_AMBULATORY_CARE_PROVIDER_SITE_OTHER): Payer: BLUE CROSS/BLUE SHIELD | Admitting: *Deleted

## 2018-08-02 DIAGNOSIS — J309 Allergic rhinitis, unspecified: Secondary | ICD-10-CM | POA: Diagnosis not present

## 2018-08-07 ENCOUNTER — Ambulatory Visit (INDEPENDENT_AMBULATORY_CARE_PROVIDER_SITE_OTHER): Payer: BLUE CROSS/BLUE SHIELD

## 2018-08-07 DIAGNOSIS — J309 Allergic rhinitis, unspecified: Secondary | ICD-10-CM

## 2018-08-22 ENCOUNTER — Ambulatory Visit (INDEPENDENT_AMBULATORY_CARE_PROVIDER_SITE_OTHER): Payer: BLUE CROSS/BLUE SHIELD | Admitting: *Deleted

## 2018-08-22 DIAGNOSIS — J309 Allergic rhinitis, unspecified: Secondary | ICD-10-CM | POA: Diagnosis not present

## 2018-09-20 ENCOUNTER — Ambulatory Visit (INDEPENDENT_AMBULATORY_CARE_PROVIDER_SITE_OTHER): Payer: BLUE CROSS/BLUE SHIELD | Admitting: *Deleted

## 2018-09-20 DIAGNOSIS — J309 Allergic rhinitis, unspecified: Secondary | ICD-10-CM | POA: Diagnosis not present

## 2018-09-27 ENCOUNTER — Ambulatory Visit (INDEPENDENT_AMBULATORY_CARE_PROVIDER_SITE_OTHER): Payer: BLUE CROSS/BLUE SHIELD

## 2018-09-27 DIAGNOSIS — J309 Allergic rhinitis, unspecified: Secondary | ICD-10-CM

## 2018-10-02 DIAGNOSIS — J301 Allergic rhinitis due to pollen: Secondary | ICD-10-CM

## 2018-10-02 NOTE — Progress Notes (Signed)
Vials exp 10-03-19 

## 2018-10-04 ENCOUNTER — Ambulatory Visit (INDEPENDENT_AMBULATORY_CARE_PROVIDER_SITE_OTHER): Payer: BLUE CROSS/BLUE SHIELD | Admitting: *Deleted

## 2018-10-04 DIAGNOSIS — J309 Allergic rhinitis, unspecified: Secondary | ICD-10-CM | POA: Diagnosis not present

## 2018-10-11 ENCOUNTER — Ambulatory Visit (INDEPENDENT_AMBULATORY_CARE_PROVIDER_SITE_OTHER): Payer: BLUE CROSS/BLUE SHIELD | Admitting: *Deleted

## 2018-10-11 DIAGNOSIS — J309 Allergic rhinitis, unspecified: Secondary | ICD-10-CM

## 2018-12-12 ENCOUNTER — Ambulatory Visit (INDEPENDENT_AMBULATORY_CARE_PROVIDER_SITE_OTHER): Payer: BLUE CROSS/BLUE SHIELD | Admitting: *Deleted

## 2018-12-12 DIAGNOSIS — J309 Allergic rhinitis, unspecified: Secondary | ICD-10-CM | POA: Diagnosis not present

## 2018-12-20 ENCOUNTER — Ambulatory Visit (INDEPENDENT_AMBULATORY_CARE_PROVIDER_SITE_OTHER): Payer: BLUE CROSS/BLUE SHIELD | Admitting: *Deleted

## 2018-12-20 DIAGNOSIS — J309 Allergic rhinitis, unspecified: Secondary | ICD-10-CM | POA: Diagnosis not present

## 2018-12-28 ENCOUNTER — Ambulatory Visit (INDEPENDENT_AMBULATORY_CARE_PROVIDER_SITE_OTHER): Payer: BLUE CROSS/BLUE SHIELD

## 2018-12-28 DIAGNOSIS — J309 Allergic rhinitis, unspecified: Secondary | ICD-10-CM

## 2019-01-04 ENCOUNTER — Ambulatory Visit (INDEPENDENT_AMBULATORY_CARE_PROVIDER_SITE_OTHER): Payer: BLUE CROSS/BLUE SHIELD | Admitting: *Deleted

## 2019-01-04 DIAGNOSIS — J309 Allergic rhinitis, unspecified: Secondary | ICD-10-CM | POA: Diagnosis not present

## 2019-01-11 ENCOUNTER — Ambulatory Visit (INDEPENDENT_AMBULATORY_CARE_PROVIDER_SITE_OTHER): Payer: BLUE CROSS/BLUE SHIELD

## 2019-01-11 DIAGNOSIS — J309 Allergic rhinitis, unspecified: Secondary | ICD-10-CM | POA: Diagnosis not present

## 2019-01-31 ENCOUNTER — Ambulatory Visit (INDEPENDENT_AMBULATORY_CARE_PROVIDER_SITE_OTHER): Payer: BLUE CROSS/BLUE SHIELD | Admitting: *Deleted

## 2019-01-31 DIAGNOSIS — J309 Allergic rhinitis, unspecified: Secondary | ICD-10-CM

## 2019-03-07 ENCOUNTER — Ambulatory Visit (INDEPENDENT_AMBULATORY_CARE_PROVIDER_SITE_OTHER): Payer: BLUE CROSS/BLUE SHIELD | Admitting: *Deleted

## 2019-03-07 DIAGNOSIS — J309 Allergic rhinitis, unspecified: Secondary | ICD-10-CM

## 2019-03-21 ENCOUNTER — Ambulatory Visit (INDEPENDENT_AMBULATORY_CARE_PROVIDER_SITE_OTHER): Payer: BLUE CROSS/BLUE SHIELD | Admitting: *Deleted

## 2019-03-21 DIAGNOSIS — J309 Allergic rhinitis, unspecified: Secondary | ICD-10-CM | POA: Diagnosis not present

## 2019-03-29 ENCOUNTER — Ambulatory Visit (INDEPENDENT_AMBULATORY_CARE_PROVIDER_SITE_OTHER): Payer: BLUE CROSS/BLUE SHIELD | Admitting: *Deleted

## 2019-03-29 DIAGNOSIS — J309 Allergic rhinitis, unspecified: Secondary | ICD-10-CM | POA: Diagnosis not present

## 2019-04-04 ENCOUNTER — Ambulatory Visit (INDEPENDENT_AMBULATORY_CARE_PROVIDER_SITE_OTHER): Payer: BLUE CROSS/BLUE SHIELD

## 2019-04-04 DIAGNOSIS — J309 Allergic rhinitis, unspecified: Secondary | ICD-10-CM | POA: Diagnosis not present

## 2019-04-17 ENCOUNTER — Ambulatory Visit (INDEPENDENT_AMBULATORY_CARE_PROVIDER_SITE_OTHER): Payer: BLUE CROSS/BLUE SHIELD | Admitting: *Deleted

## 2019-04-17 DIAGNOSIS — J309 Allergic rhinitis, unspecified: Secondary | ICD-10-CM | POA: Diagnosis not present

## 2019-04-17 NOTE — Progress Notes (Signed)
VIALS EXP 04-16-2020 

## 2019-04-18 DIAGNOSIS — J301 Allergic rhinitis due to pollen: Secondary | ICD-10-CM | POA: Diagnosis not present

## 2019-05-02 ENCOUNTER — Ambulatory Visit (INDEPENDENT_AMBULATORY_CARE_PROVIDER_SITE_OTHER): Payer: BLUE CROSS/BLUE SHIELD

## 2019-05-02 DIAGNOSIS — J309 Allergic rhinitis, unspecified: Secondary | ICD-10-CM | POA: Diagnosis not present

## 2019-05-30 ENCOUNTER — Ambulatory Visit (INDEPENDENT_AMBULATORY_CARE_PROVIDER_SITE_OTHER): Payer: BLUE CROSS/BLUE SHIELD | Admitting: *Deleted

## 2019-05-30 DIAGNOSIS — J309 Allergic rhinitis, unspecified: Secondary | ICD-10-CM | POA: Diagnosis not present

## 2019-06-06 ENCOUNTER — Ambulatory Visit (INDEPENDENT_AMBULATORY_CARE_PROVIDER_SITE_OTHER): Payer: BLUE CROSS/BLUE SHIELD | Admitting: *Deleted

## 2019-06-06 DIAGNOSIS — J309 Allergic rhinitis, unspecified: Secondary | ICD-10-CM | POA: Diagnosis not present

## 2019-06-19 ENCOUNTER — Ambulatory Visit (INDEPENDENT_AMBULATORY_CARE_PROVIDER_SITE_OTHER): Payer: BLUE CROSS/BLUE SHIELD | Admitting: *Deleted

## 2019-06-19 DIAGNOSIS — J309 Allergic rhinitis, unspecified: Secondary | ICD-10-CM

## 2019-07-04 ENCOUNTER — Ambulatory Visit (INDEPENDENT_AMBULATORY_CARE_PROVIDER_SITE_OTHER): Payer: BLUE CROSS/BLUE SHIELD

## 2019-07-04 DIAGNOSIS — J309 Allergic rhinitis, unspecified: Secondary | ICD-10-CM | POA: Diagnosis not present

## 2019-07-11 ENCOUNTER — Ambulatory Visit (INDEPENDENT_AMBULATORY_CARE_PROVIDER_SITE_OTHER): Payer: BLUE CROSS/BLUE SHIELD | Admitting: *Deleted

## 2019-07-11 DIAGNOSIS — J309 Allergic rhinitis, unspecified: Secondary | ICD-10-CM | POA: Diagnosis not present

## 2019-07-12 ENCOUNTER — Other Ambulatory Visit: Payer: Self-pay | Admitting: *Deleted

## 2019-07-12 DIAGNOSIS — Z20822 Contact with and (suspected) exposure to covid-19: Secondary | ICD-10-CM

## 2019-07-13 LAB — NOVEL CORONAVIRUS, NAA: SARS-CoV-2, NAA: NOT DETECTED

## 2019-07-31 ENCOUNTER — Ambulatory Visit (INDEPENDENT_AMBULATORY_CARE_PROVIDER_SITE_OTHER): Payer: BLUE CROSS/BLUE SHIELD | Admitting: *Deleted

## 2019-07-31 DIAGNOSIS — J309 Allergic rhinitis, unspecified: Secondary | ICD-10-CM

## 2019-08-06 ENCOUNTER — Ambulatory Visit (INDEPENDENT_AMBULATORY_CARE_PROVIDER_SITE_OTHER): Payer: BLUE CROSS/BLUE SHIELD | Admitting: *Deleted

## 2019-08-06 DIAGNOSIS — J309 Allergic rhinitis, unspecified: Secondary | ICD-10-CM

## 2019-08-28 ENCOUNTER — Ambulatory Visit (INDEPENDENT_AMBULATORY_CARE_PROVIDER_SITE_OTHER): Payer: BLUE CROSS/BLUE SHIELD | Admitting: *Deleted

## 2019-08-28 DIAGNOSIS — J309 Allergic rhinitis, unspecified: Secondary | ICD-10-CM | POA: Diagnosis not present

## 2019-09-12 ENCOUNTER — Ambulatory Visit: Payer: Self-pay | Admitting: *Deleted

## 2019-09-17 ENCOUNTER — Ambulatory Visit (INDEPENDENT_AMBULATORY_CARE_PROVIDER_SITE_OTHER): Payer: BLUE CROSS/BLUE SHIELD | Admitting: *Deleted

## 2019-09-17 DIAGNOSIS — J309 Allergic rhinitis, unspecified: Secondary | ICD-10-CM | POA: Diagnosis not present

## 2019-09-25 NOTE — Progress Notes (Signed)
Vials exp 09-24-20 

## 2019-09-26 DIAGNOSIS — J301 Allergic rhinitis due to pollen: Secondary | ICD-10-CM

## 2019-10-17 ENCOUNTER — Ambulatory Visit (INDEPENDENT_AMBULATORY_CARE_PROVIDER_SITE_OTHER): Payer: BC Managed Care – PPO | Admitting: *Deleted

## 2019-10-17 DIAGNOSIS — J309 Allergic rhinitis, unspecified: Secondary | ICD-10-CM | POA: Diagnosis not present

## 2019-10-21 ENCOUNTER — Other Ambulatory Visit: Payer: Self-pay

## 2019-10-21 DIAGNOSIS — Z20822 Contact with and (suspected) exposure to covid-19: Secondary | ICD-10-CM

## 2019-10-22 LAB — NOVEL CORONAVIRUS, NAA: SARS-CoV-2, NAA: NOT DETECTED

## 2019-12-12 ENCOUNTER — Ambulatory Visit (INDEPENDENT_AMBULATORY_CARE_PROVIDER_SITE_OTHER): Payer: BC Managed Care – PPO

## 2019-12-12 DIAGNOSIS — J309 Allergic rhinitis, unspecified: Secondary | ICD-10-CM

## 2019-12-24 ENCOUNTER — Ambulatory Visit (INDEPENDENT_AMBULATORY_CARE_PROVIDER_SITE_OTHER): Payer: BC Managed Care – PPO

## 2019-12-24 DIAGNOSIS — J3089 Other allergic rhinitis: Secondary | ICD-10-CM

## 2020-01-10 ENCOUNTER — Ambulatory Visit: Payer: No Typology Code available for payment source | Attending: Internal Medicine

## 2020-01-10 ENCOUNTER — Ambulatory Visit (INDEPENDENT_AMBULATORY_CARE_PROVIDER_SITE_OTHER): Payer: BC Managed Care – PPO | Admitting: *Deleted

## 2020-01-10 DIAGNOSIS — J309 Allergic rhinitis, unspecified: Secondary | ICD-10-CM

## 2020-01-10 DIAGNOSIS — Z20822 Contact with and (suspected) exposure to covid-19: Secondary | ICD-10-CM

## 2020-01-11 LAB — NOVEL CORONAVIRUS, NAA: SARS-CoV-2, NAA: NOT DETECTED

## 2020-01-16 ENCOUNTER — Ambulatory Visit (INDEPENDENT_AMBULATORY_CARE_PROVIDER_SITE_OTHER): Payer: BC Managed Care – PPO

## 2020-01-16 DIAGNOSIS — J309 Allergic rhinitis, unspecified: Secondary | ICD-10-CM | POA: Diagnosis not present

## 2020-02-06 ENCOUNTER — Ambulatory Visit (INDEPENDENT_AMBULATORY_CARE_PROVIDER_SITE_OTHER): Payer: BC Managed Care – PPO

## 2020-02-06 DIAGNOSIS — J309 Allergic rhinitis, unspecified: Secondary | ICD-10-CM | POA: Diagnosis not present

## 2020-02-13 ENCOUNTER — Ambulatory Visit (INDEPENDENT_AMBULATORY_CARE_PROVIDER_SITE_OTHER): Payer: BC Managed Care – PPO

## 2020-02-13 DIAGNOSIS — J309 Allergic rhinitis, unspecified: Secondary | ICD-10-CM

## 2020-02-19 ENCOUNTER — Ambulatory Visit (INDEPENDENT_AMBULATORY_CARE_PROVIDER_SITE_OTHER): Payer: BC Managed Care – PPO

## 2020-02-19 DIAGNOSIS — J309 Allergic rhinitis, unspecified: Secondary | ICD-10-CM

## 2020-03-11 ENCOUNTER — Ambulatory Visit (INDEPENDENT_AMBULATORY_CARE_PROVIDER_SITE_OTHER): Payer: BC Managed Care – PPO | Admitting: *Deleted

## 2020-03-11 DIAGNOSIS — J309 Allergic rhinitis, unspecified: Secondary | ICD-10-CM | POA: Diagnosis not present

## 2020-03-31 ENCOUNTER — Ambulatory Visit (INDEPENDENT_AMBULATORY_CARE_PROVIDER_SITE_OTHER): Payer: BC Managed Care – PPO

## 2020-03-31 DIAGNOSIS — J309 Allergic rhinitis, unspecified: Secondary | ICD-10-CM

## 2020-05-07 ENCOUNTER — Ambulatory Visit (INDEPENDENT_AMBULATORY_CARE_PROVIDER_SITE_OTHER): Payer: BC Managed Care – PPO

## 2020-05-07 DIAGNOSIS — J309 Allergic rhinitis, unspecified: Secondary | ICD-10-CM

## 2020-05-19 ENCOUNTER — Ambulatory Visit (INDEPENDENT_AMBULATORY_CARE_PROVIDER_SITE_OTHER): Payer: BC Managed Care – PPO

## 2020-05-19 DIAGNOSIS — J309 Allergic rhinitis, unspecified: Secondary | ICD-10-CM

## 2020-05-25 NOTE — Progress Notes (Signed)
EXP 05/26/21 

## 2020-05-26 DIAGNOSIS — J301 Allergic rhinitis due to pollen: Secondary | ICD-10-CM | POA: Diagnosis not present

## 2020-06-17 NOTE — Progress Notes (Signed)
Vials not needed 06-17-21

## 2020-06-25 ENCOUNTER — Ambulatory Visit (INDEPENDENT_AMBULATORY_CARE_PROVIDER_SITE_OTHER): Payer: BC Managed Care – PPO

## 2020-06-25 DIAGNOSIS — J309 Allergic rhinitis, unspecified: Secondary | ICD-10-CM

## 2020-07-01 ENCOUNTER — Ambulatory Visit: Payer: BC Managed Care – PPO | Admitting: Allergy

## 2020-07-07 ENCOUNTER — Ambulatory Visit (INDEPENDENT_AMBULATORY_CARE_PROVIDER_SITE_OTHER): Payer: BC Managed Care – PPO

## 2020-07-07 DIAGNOSIS — J309 Allergic rhinitis, unspecified: Secondary | ICD-10-CM | POA: Diagnosis not present

## 2020-07-16 ENCOUNTER — Ambulatory Visit (INDEPENDENT_AMBULATORY_CARE_PROVIDER_SITE_OTHER): Payer: BC Managed Care – PPO

## 2020-07-16 DIAGNOSIS — J309 Allergic rhinitis, unspecified: Secondary | ICD-10-CM

## 2020-07-23 ENCOUNTER — Ambulatory Visit (INDEPENDENT_AMBULATORY_CARE_PROVIDER_SITE_OTHER): Payer: BC Managed Care – PPO

## 2020-07-23 DIAGNOSIS — J309 Allergic rhinitis, unspecified: Secondary | ICD-10-CM

## 2020-08-13 ENCOUNTER — Ambulatory Visit (INDEPENDENT_AMBULATORY_CARE_PROVIDER_SITE_OTHER): Payer: BC Managed Care – PPO

## 2020-08-13 DIAGNOSIS — J309 Allergic rhinitis, unspecified: Secondary | ICD-10-CM | POA: Diagnosis not present

## 2020-08-20 ENCOUNTER — Ambulatory Visit (INDEPENDENT_AMBULATORY_CARE_PROVIDER_SITE_OTHER): Payer: BC Managed Care – PPO | Admitting: *Deleted

## 2020-08-20 DIAGNOSIS — J309 Allergic rhinitis, unspecified: Secondary | ICD-10-CM

## 2020-08-26 ENCOUNTER — Ambulatory Visit (INDEPENDENT_AMBULATORY_CARE_PROVIDER_SITE_OTHER): Payer: BC Managed Care – PPO

## 2020-08-26 DIAGNOSIS — J309 Allergic rhinitis, unspecified: Secondary | ICD-10-CM

## 2020-09-03 ENCOUNTER — Encounter: Payer: Self-pay | Admitting: Allergy

## 2020-09-03 ENCOUNTER — Ambulatory Visit (INDEPENDENT_AMBULATORY_CARE_PROVIDER_SITE_OTHER): Payer: BC Managed Care – PPO | Admitting: Allergy

## 2020-09-03 ENCOUNTER — Other Ambulatory Visit: Payer: Self-pay

## 2020-09-03 VITALS — BP 108/74 | HR 60 | Temp 97.3°F | Resp 14 | Ht <= 58 in | Wt 179.4 lb

## 2020-09-03 DIAGNOSIS — H1013 Acute atopic conjunctivitis, bilateral: Secondary | ICD-10-CM | POA: Diagnosis not present

## 2020-09-03 DIAGNOSIS — R05 Cough: Secondary | ICD-10-CM | POA: Diagnosis not present

## 2020-09-03 DIAGNOSIS — R059 Cough, unspecified: Secondary | ICD-10-CM

## 2020-09-03 DIAGNOSIS — J3089 Other allergic rhinitis: Secondary | ICD-10-CM

## 2020-09-03 NOTE — Patient Instructions (Addendum)
Seasonal and perennial allergic rhinitis  Continue zyrtec 10 mg 1-2 times a day as needed.  A prescription has been provided for Dymista (azelastine/fluticasone) nasal spray, 1-2 sprays per nostril twice daily as needed. Proper nasal spray technique has been discussed and demonstrated.  If Dymista is not covered then will prescribe Azelastine (astelin) separately for nasal drainage and use flonase for nasal congestion control  continue nasal saline spray (i.e. Oceanair) as needed prior to medicated nasal sprays.  Continue allergen immunotherapy (allergy shots) for at least another year and then will revisit when we can discontinue.  Have access to epinephrine device on days of your injections  Seasonal allergic conjunctivitis  Use olopatadine 0.2% 1 drop each eye daily as needed for itchy/watery red eyes  Cough  Most likely upper airway cough syndrome with evidence of post-nasal drainage.    Nasal spray regimen as above.  Follow-up in 6-12 months or sooner if needed

## 2020-09-03 NOTE — Progress Notes (Signed)
Follow-up Note  RE: Brenda Figueroa MRN: [id number] DOB: 1996-11-22 Date of Office Visit: 09/03/2020   History of present illness: Brenda Figueroa is a 24 y.o. female presenting today for follow-up of allergic rhinitis with conjunctivitis and cough.  Her last visit was 02/23/16 by Dr. Nunzio Cobbs.  Presents with her mother today.  She is on allergen immunotherapy at maintenance monthly dosing.  She has had more sneezing this week and required benadryl to help improve it.  She does usually take zyrtec twice a day.  Mother does report the AIT has been very helpful in improving her allergy symptoms.  She still however has some cough and she reports having nasal drainage.  She has flonase but is not using dymista recommended at last visit.  She does use nasal saline spray.   Mother denies she has had any health changes since last visit.  She has had both doses of Pfizer vaccine.      Review of systems: Review of Systems  Constitutional: Negative.   HENT:       See HPI  Eyes: Negative.   Respiratory:       See HPI  Cardiovascular: Negative.   Gastrointestinal: Negative.   Musculoskeletal: Negative.   Skin: Negative.   Neurological: Negative.     All other systems negative unless noted above in HPI  Past medical/social/surgical/family history have been reviewed and are unchanged unless specifically indicated below.  No changes  Medication List: Current Outpatient Medications  Medication Sig Dispense Refill  . Azelastine-Fluticasone (DYMISTA) 137-50 MCG/ACT SUSP USE 1-2 SPRAYS IN EACH NOSTRIL TWICE DAILY 1 Bottle 5  . cetirizine (ZYRTEC) 10 MG tablet Take by mouth.    . EPINEPHrine (EPIPEN 2-PAK) 0.3 mg/0.3 mL IJ SOAJ injection USE AS DIRECTED FOR LIFE THREATENING ALLERGIC REACTIONS 2 each 1  . fluticasone (FLONASE) 50 MCG/ACT nasal spray Place 2 sprays into both nostrils daily. Reported on 02/23/2016  12  . levocetirizine (XYZAL) 5 MG tablet TAKE 1 TABLET BY MOUTH ONCE DAILY IF  NEEDED 30 tablet 1  . levothyroxine (SYNTHROID, LEVOTHROID) 100 MCG tablet     . Multiple Vitamin (MULTIVITAMIN) capsule Take by mouth.    . Multiple Vitamins-Minerals (MULTIVITAMIN WITH MINERALS) tablet Take by mouth. Reported on 02/23/2016    . Olopatadine HCl (PAZEO) 0.7 % SOLN Place 1 drop into both eyes daily as needed. 1 Bottle 5  . sodium chloride (OCEAN) 0.65 % nasal spray Place into the nose.    . triamcinolone cream (KENALOG) 0.1 % PLEASE SEE ATTACHED FOR DETAILED DIRECTIONS    . vitamin B-12 (CYANOCOBALAMIN) 500 MCG tablet Take 500 mcg by mouth daily.    Marland Kitchen ascorbic acid (VITAMIN C) 100 MG tablet Take by mouth.    . Azelastine-Fluticasone (DYMISTA) 137-50 MCG/ACT SUSP Place 2 sprays into both nostrils 1 day or 1 dose. 23 g 5  . fish oil-omega-3 fatty acids 1000 MG capsule Take by mouth.    . fluticasone (FLONASE) 50 MCG/ACT nasal spray     . levocetirizine (XYZAL) 5 MG tablet Take 1 tablet (5 mg total) by mouth every evening. 30 tablet 2  . levothyroxine (SYNTHROID, LEVOTHROID) 100 MCG tablet Reported on 02/23/2016  2  . melatonin 5 MG TABS Take 1 tablet by mouth at bedtime.    . pyridOXINE (VITAMIN B-6) 25 MG tablet Take by mouth. Reported on 02/23/2016     No current facility-administered medications for this visit.     Known medication allergies: Allergies  Allergen Reactions  .  Doxycycline Rash     Physical examination: Blood pressure 108/74, pulse 60, temperature (!) 97.3 F (36.3 C), resp. rate 14, height 4' 9.5" (1.461 m), weight 179 lb 6.4 oz (81.4 kg), SpO2 98 %.  General: Alert, interactive, in no acute distress. HEENT: TMs pearly gray, turbinates minimally edematous with clear discharge, post-pharynx non erythematous. Neck: Supple without lymphadenopathy. Lungs: Clear to auscultation without wheezing, rhonchi or rales. {no increased work of breathing. CV: Normal S1, S2 without murmurs. Abdomen: Nondistended, nontender. Skin: Warm and dry, without lesions or  rashes. Extremities:  No clubbing, cyanosis or edema. Neuro:   Grossly intact.  Diagnositics/Labs: None today  Assessment and plan:   Seasonal and perennial allergic rhinitis  Continue zyrtec 10 mg 1-2 times a day as needed.  A prescription has been provided for Dymista (azelastine/fluticasone) nasal spray, 1-2 sprays per nostril twice daily as needed. Proper nasal spray technique has been discussed and demonstrated.  If Dymista is not covered then will prescribe Azelastine (astelin) separately for nasal drainage and use flonase for nasal congestion control  continue nasal saline spray (i.e. Oceanair) as needed prior to medicated nasal sprays.  Continue allergen immunotherapy (allergy shots) for at least another year and then will revisit when we can discontinue.  Have access to epinephrine device on days of your injections  Seasonal allergic conjunctivitis  Use olopatadine 0.2% 1 drop each eye daily as needed for itchy/watery red eyes  Cough  Most likely upper airway cough syndrome with evidence of post-nasal drainage.    Nasal spray regimen as above.  Follow-up in 6-12 months or sooner if needed   I appreciate the opportunity to take part in Komal's care. Please do not hesitate to contact me with questions.  Sincerely,   Margo Aye, MD Allergy/Immunology [facility]

## 2020-09-05 ENCOUNTER — Encounter: Payer: Self-pay | Admitting: Allergy

## 2020-09-05 MED ORDER — AZELASTINE-FLUTICASONE 137-50 MCG/ACT NA SUSP: 2.0000 | NASAL | 5 refills | Status: DC

## 2020-09-05 MED ORDER — EPINEPHRINE 0.3 MG/0.3ML IJ SOAJ: INTRAMUSCULAR | 1 refills | Status: DC

## 2020-09-07 ENCOUNTER — Telehealth: Payer: Self-pay

## 2020-09-07 NOTE — Telephone Encounter (Signed)
Patients mom called back stating the pharmacy did not receive the patients refills from last week. I checked and it is the same pharmacy we sent refills to on 09/05/2020.   Please Advise.

## 2020-09-07 NOTE — Telephone Encounter (Signed)
Called pharmacy and was informed the epi was ready for pick up and the Dymista needed a prior authorization. The pharmacy did state if we split it then insurance will cover the nasal sprays. Please advise.

## 2020-09-08 ENCOUNTER — Other Ambulatory Visit: Payer: Self-pay

## 2020-09-08 MED ORDER — AZELASTINE HCL 0.1 % NA SOLN: 2.0000 | Freq: Two times a day (BID) | NASAL | 5 refills | Status: AC | PRN

## 2020-09-08 MED ORDER — FLUTICASONE PROPIONATE 50 MCG/ACT NA SUSP: 2.0000 | Freq: Every day | NASAL | 5 refills | Status: AC | PRN

## 2020-09-08 NOTE — Telephone Encounter (Signed)
Sent in the split medications

## 2020-09-08 NOTE — Telephone Encounter (Signed)
Yes ok to split dymista

## 2020-09-11 ENCOUNTER — Telehealth: Payer: Self-pay | Admitting: *Deleted

## 2020-09-11 NOTE — Telephone Encounter (Signed)
PA has been submitted through [facility] [employer] for azelastine-fluticasone and has been approved. PA approval form has been faxed to patient's pharmacy, labeled, and placed in bulk scanning.

## 2020-10-21 ENCOUNTER — Ambulatory Visit (INDEPENDENT_AMBULATORY_CARE_PROVIDER_SITE_OTHER): Payer: BC Managed Care – PPO

## 2020-10-21 DIAGNOSIS — J309 Allergic rhinitis, unspecified: Secondary | ICD-10-CM

## 2020-10-27 ENCOUNTER — Ambulatory Visit (INDEPENDENT_AMBULATORY_CARE_PROVIDER_SITE_OTHER): Payer: BC Managed Care – PPO | Admitting: *Deleted

## 2020-10-27 DIAGNOSIS — J309 Allergic rhinitis, unspecified: Secondary | ICD-10-CM

## 2020-11-03 ENCOUNTER — Ambulatory Visit (INDEPENDENT_AMBULATORY_CARE_PROVIDER_SITE_OTHER): Payer: BC Managed Care – PPO

## 2020-11-03 DIAGNOSIS — J309 Allergic rhinitis, unspecified: Secondary | ICD-10-CM

## 2020-11-12 ENCOUNTER — Ambulatory Visit (INDEPENDENT_AMBULATORY_CARE_PROVIDER_SITE_OTHER): Payer: BC Managed Care – PPO

## 2020-11-12 DIAGNOSIS — J309 Allergic rhinitis, unspecified: Secondary | ICD-10-CM

## 2020-12-24 ENCOUNTER — Ambulatory Visit (INDEPENDENT_AMBULATORY_CARE_PROVIDER_SITE_OTHER): Payer: BC Managed Care – PPO

## 2020-12-24 DIAGNOSIS — J309 Allergic rhinitis, unspecified: Secondary | ICD-10-CM | POA: Diagnosis not present

## 2021-01-13 DIAGNOSIS — J301 Allergic rhinitis due to pollen: Secondary | ICD-10-CM | POA: Diagnosis not present

## 2021-01-13 NOTE — Progress Notes (Signed)
VIALS EXP 01-13-22 

## 2021-01-15 ENCOUNTER — Ambulatory Visit (INDEPENDENT_AMBULATORY_CARE_PROVIDER_SITE_OTHER): Payer: BC Managed Care – PPO | Admitting: *Deleted

## 2021-01-15 DIAGNOSIS — J309 Allergic rhinitis, unspecified: Secondary | ICD-10-CM | POA: Diagnosis not present

## 2021-02-09 ENCOUNTER — Ambulatory Visit (INDEPENDENT_AMBULATORY_CARE_PROVIDER_SITE_OTHER): Payer: BC Managed Care – PPO | Admitting: *Deleted

## 2021-02-09 DIAGNOSIS — J309 Allergic rhinitis, unspecified: Secondary | ICD-10-CM

## 2021-02-19 ENCOUNTER — Ambulatory Visit (INDEPENDENT_AMBULATORY_CARE_PROVIDER_SITE_OTHER): Payer: BC Managed Care – PPO | Admitting: *Deleted

## 2021-02-19 DIAGNOSIS — J309 Allergic rhinitis, unspecified: Secondary | ICD-10-CM | POA: Diagnosis not present

## 2021-02-23 ENCOUNTER — Ambulatory Visit (INDEPENDENT_AMBULATORY_CARE_PROVIDER_SITE_OTHER): Payer: BC Managed Care – PPO | Admitting: *Deleted

## 2021-02-23 DIAGNOSIS — J309 Allergic rhinitis, unspecified: Secondary | ICD-10-CM | POA: Diagnosis not present

## 2021-03-02 ENCOUNTER — Ambulatory Visit (INDEPENDENT_AMBULATORY_CARE_PROVIDER_SITE_OTHER): Payer: BC Managed Care – PPO | Admitting: *Deleted

## 2021-03-02 DIAGNOSIS — J309 Allergic rhinitis, unspecified: Secondary | ICD-10-CM

## 2021-03-10 ENCOUNTER — Ambulatory Visit (INDEPENDENT_AMBULATORY_CARE_PROVIDER_SITE_OTHER): Payer: BC Managed Care – PPO

## 2021-03-10 DIAGNOSIS — J309 Allergic rhinitis, unspecified: Secondary | ICD-10-CM | POA: Diagnosis not present

## 2021-04-08 ENCOUNTER — Ambulatory Visit (INDEPENDENT_AMBULATORY_CARE_PROVIDER_SITE_OTHER): Payer: BC Managed Care – PPO | Admitting: *Deleted

## 2021-04-08 DIAGNOSIS — J309 Allergic rhinitis, unspecified: Secondary | ICD-10-CM | POA: Diagnosis not present

## 2021-05-05 ENCOUNTER — Ambulatory Visit (INDEPENDENT_AMBULATORY_CARE_PROVIDER_SITE_OTHER): Payer: BC Managed Care – PPO

## 2021-05-05 DIAGNOSIS — J309 Allergic rhinitis, unspecified: Secondary | ICD-10-CM | POA: Diagnosis not present

## 2021-06-09 ENCOUNTER — Ambulatory Visit (INDEPENDENT_AMBULATORY_CARE_PROVIDER_SITE_OTHER): Payer: BC Managed Care – PPO

## 2021-06-09 DIAGNOSIS — J309 Allergic rhinitis, unspecified: Secondary | ICD-10-CM

## 2021-07-07 ENCOUNTER — Ambulatory Visit (INDEPENDENT_AMBULATORY_CARE_PROVIDER_SITE_OTHER): Payer: BC Managed Care – PPO | Admitting: *Deleted

## 2021-07-07 DIAGNOSIS — J309 Allergic rhinitis, unspecified: Secondary | ICD-10-CM | POA: Diagnosis not present

## 2021-07-15 DIAGNOSIS — J301 Allergic rhinitis due to pollen: Secondary | ICD-10-CM | POA: Diagnosis not present

## 2021-07-15 NOTE — Progress Notes (Signed)
VIALS MADE. EXP 07-15-22 

## 2021-08-04 ENCOUNTER — Ambulatory Visit: Payer: Self-pay

## 2021-08-04 DIAGNOSIS — J309 Allergic rhinitis, unspecified: Secondary | ICD-10-CM

## 2021-08-17 ENCOUNTER — Ambulatory Visit (INDEPENDENT_AMBULATORY_CARE_PROVIDER_SITE_OTHER): Payer: BC Managed Care – PPO | Admitting: *Deleted

## 2021-08-17 DIAGNOSIS — J309 Allergic rhinitis, unspecified: Secondary | ICD-10-CM | POA: Diagnosis not present

## 2021-09-12 ENCOUNTER — Other Ambulatory Visit: Payer: Self-pay | Admitting: Allergy

## 2021-09-30 NOTE — Progress Notes (Signed)
[address] Debbora Presto [place] [address] Dept: [phone]  FOLLOW UP NOTE  Patient ID: Lexis Potenza, female    DOB: 1996/06/17  Age: 25 y.o. MRN: [id number] Date of Office Visit: 10/01/2021  Assessment  Chief Complaint: Follow-up  HPI Loisann Matherly is a 25 year old female who presents to the clinic for follow-up visit.  She was last seen in this clinic on 09/03/2020 by Dr. Delorse Lek for evaluation of allergic rhinitis on allergen immunotherapy allergic conjunctivitis, and cough.  She is accompanied by her mother who assists with history.  At today's visit, she reports allergic rhinitis has been moderately well controlled with symptoms including occasional nasal congestion and occasional sneeze frequently occurring at night.  She continues cetirizine 10 mg once a day and uses Flonase and saline rinses about once a week with relief of symptoms.  She continues allergen immunotherapy with no large or local reactions.  She reports a significant decrease in her symptoms of allergic rhinitis while continuing on allergen immunotherapy.  Her last environmental allergy skin testing was in 2017 and was positive to the panel with the exception of cockroach.  She began allergen immunotherapy at that time. Allergic conjunctivitis is reported as moderately well controlled with olopatadine as needed.  She reports her cough has been much better since her last visit to this clinic.  The cough frequently produces clear mucus.  She denies shortness of breath and wheeze with activity and rest.  She denies reflux and is not currently taking a medication to control reflux.  Her current medications are listed in the chart.     Drug Allergies:  Allergies  Allergen Reactions   Doxycycline Rash    Physical Exam: BP 118/76   Pulse 84   Temp 98.5 F (36.9 C) (Temporal)   Resp 18   Wt 179 lb 2 oz (81.3 kg)   HC 57.5" (146.1 cm)   SpO2 99%   BMI 38.09 kg/m    Physical Exam Vitals reviewed.   Constitutional:      Appearance: Normal appearance.  HENT:     Head: Normocephalic and atraumatic.     Right Ear: Tympanic membrane normal.     Left Ear: Tympanic membrane normal.     Nose:     Comments: Bilateral nares slightly erythematous with clear nasal drainage noted.  Pharynx normal.  Ears normal.  Eyes normal.    Mouth/Throat:     Pharynx: Oropharynx is clear.  Eyes:     Conjunctiva/sclera: Conjunctivae normal.  Cardiovascular:     Rate and Rhythm: Normal rate and regular rhythm.     Heart sounds: Normal heart sounds. No murmur heard. Pulmonary:     Effort: Pulmonary effort is normal.     Breath sounds: Normal breath sounds.     Comments: Lungs clear to auscultation Musculoskeletal:        General: Normal range of motion.     Cervical back: Normal range of motion and neck supple.  Skin:    General: Skin is warm and dry.  Neurological:     Mental Status: She is alert and oriented to person, place, and time.  Psychiatric:        Mood and Affect: Mood normal.        Behavior: Behavior normal.        Thought Content: Thought content normal.        Judgment: Judgment normal.    Assessment and Plan: 1. Seasonal and perennial allergic rhinitis   2. Allergic conjunctivitis of  both eyes   3. Chronic cough   4. Non-seasonal allergic rhinitis due to other allergic trigger     Meds ordered this encounter  Medications   EPINEPHrine (EPIPEN 2-PAK) 0.3 mg/0.3 mL IJ SOAJ injection    Sig: USE AS DIRECTED FOR LIFE THREATENING ALLERGIC REACTIONS    Dispense:  2 each    Refill:  1    Please dispense mylan or teva brand     Patient Instructions  Allergic rhinitis Continue allergen avoidance measures directed toward pollens, mold, and dust mite, dog and cat as listed below Continue an antihistamine once a day as needed for runny nose or itch.Remember to rotate to a different antihistamine about every 3 months. Some examples of over the counter antihistamines include Zyrtec  (cetirizine), Xyzal (levocetirizine), Allegra (fexofenadine), and Claritin (loratidine).  Continue Flonase 1 spray in each nostril once a day as needed for stuffy nose. In the right nostril, point the applicator out toward the right ear. In the left nostril, point the applicator out toward the left ear Consider saline nasal rinses as needed for nasal symptoms. Use this before any medicated nasal sprays for best result Continue allergen immunotherapy once a month and have access to an epinephrine autoinjector sent Let us know if you would like to retest for environmental allergies. This will give [place] some guidance about when to stop allergy immunotherapy  Allergic conjunctivitis Some over the counter eye drops include Pataday one drop in each eye once a day as needed for red, itchy eyes OR Zaditor one drop in each eye twice a day as needed for red itchy eyes.  Cough Likely upper airway cough syndrome Continues in the treatment plans as listed above for allergic rhinitis  Call the clinic if this treatment plan is not working well for you  Follow up in 1 year or sooner if needed.   Return in about 1 year (around 10/01/2022), or if symptoms worsen or fail to improve.    Thank you for the opportunity to care for this patient.  Please do not hesitate to contact me with questions.  Thermon Leyland, FNP [facility]

## 2021-09-30 NOTE — Patient Instructions (Addendum)
Allergic rhinitis Continue allergen avoidance measures directed toward pollens, mold, and dust mite, dog and cat as listed below Continue an antihistamine once a day as needed for runny nose or itch.Remember to rotate to a different antihistamine about every 3 months. Some examples of over the counter antihistamines include Zyrtec (cetirizine), Xyzal (levocetirizine), Allegra (fexofenadine), and Claritin (loratidine).  Continue Flonase 1 spray in each nostril once a day as needed for stuffy nose. In the right nostril, point the applicator out toward the right ear. In the left nostril, point the applicator out toward the left ear Consider saline nasal rinses as needed for nasal symptoms. Use this before any medicated nasal sprays for best result Continue allergen immunotherapy once a month and have access to an epinephrine autoinjector sent Let us know if you would like to retest for environmental allergies. This will give [place] some guidance about when to stop allergy immunotherapy  Allergic conjunctivitis Some over the counter eye drops include Pataday one drop in each eye once a day as needed for red, itchy eyes OR Zaditor one drop in each eye twice a day as needed for red itchy eyes.  Cough Likely upper airway cough syndrome Continue with the treatment plans as listed above for allergic rhinitis  Call the clinic if this treatment plan is not working well for you  Follow up in 1 year or sooner if needed.  Reducing Pollen Exposure The American Academy of Allergy, Asthma and Immunology suggests the following steps to reduce your exposure to pollen during allergy seasons. Do not hang sheets or clothing out to dry; pollen may collect on these items. Do not mow lawns or spend time around freshly cut grass; mowing stirs up pollen. Keep windows closed at night.  Keep car windows closed while driving. Minimize morning activities outdoors, a time when pollen counts are usually at their highest. Stay  indoors as much as possible when pollen counts or humidity is high and on windy days when pollen tends to remain in the air longer. Use air conditioning when possible.  Many air conditioners have filters that trap the pollen spores. Use a HEPA room air filter to remove pollen form the indoor air you breathe.  Control of Mold Allergen Mold and fungi can grow on a variety of surfaces provided certain temperature and moisture conditions exist.  Outdoor molds grow on plants, decaying vegetation and soil.  The major outdoor mold, Alternaria and Cladosporium, are found in very high numbers during hot and dry conditions.  Generally, a late Summer - Fall peak is seen for common outdoor fungal spores.  Rain will temporarily lower outdoor mold spore count, but counts rise rapidly when the rainy period ends.  The most important indoor molds are Aspergillus and Penicillium.  Dark, humid and poorly ventilated basements are ideal sites for mold growth.  The next most common sites of mold growth are the bathroom and the kitchen.  Outdoor [employer] Use air conditioning and keep windows closed Avoid exposure to decaying vegetation. Avoid leaf raking. Avoid grain handling. Consider wearing a face mask if working in moldy areas.  Indoor Mold Control Maintain humidity below 50%. Clean washable surfaces with 5% bleach solution. Remove sources e.g. Contaminated carpets.   Control of Dog or Cat Allergen Avoidance is the best way to manage a dog or cat allergy. If you have a dog or cat and are allergic to dog or cats, consider removing the dog or cat from the home. If you have a dog  or cat but don't want to find it a new home, or if your family wants a pet even though someone in the household is allergic, here are some strategies that may help keep symptoms at bay:  Keep the pet out of your bedroom and restrict it to only a few rooms. Be advised that keeping the dog or cat in only one room will not limit the  allergens to that room. Don't pet, hug or kiss the dog or cat; if you do, wash your hands with soap and water. High-efficiency particulate air (HEPA) cleaners run continuously in a bedroom or living room can reduce allergen levels over time. Regular use of a high-efficiency vacuum cleaner or a central vacuum can reduce allergen levels. Giving your dog or cat a bath at least once a week can reduce airborne allergen.   Control of Dust Mite Allergen Dust mites play a major role in allergic asthma and rhinitis. They occur in environments with high humidity wherever human skin is found. Dust mites absorb humidity from the atmosphere (ie, they do not drink) and feed on organic matter (including shed human and animal skin). Dust mites are a microscopic type of insect that you cannot see with the naked eye. High levels of dust mites have been detected from mattresses, pillows, carpets, upholstered furniture, bed covers, clothes, soft toys and any woven material. The principal allergen of the dust mite is found in its feces. A gram of dust may contain 1,000 mites and 250,000 fecal particles. Mite antigen is easily measured in the air during house cleaning activities. Dust mites do not bite and do not cause harm to humans, other than by triggering allergies/asthma.  Ways to decrease your exposure to dust mites in your home:  1. Encase mattresses, box springs and pillows with a mite-impermeable barrier or cover  2. Wash sheets, blankets and drapes weekly in hot water (130 F) with detergent and dry them in a dryer on the hot setting.  3. Have the room cleaned frequently with a vacuum cleaner and a damp dust-mop. For carpeting or rugs, vacuuming with a vacuum cleaner equipped with a high-efficiency particulate air (HEPA) filter. The dust mite allergic individual should not be in a room which is being cleaned and should wait 1 hour after cleaning before going into the room.  4. Do not sleep on upholstered  furniture (eg, couches).  5. If possible removing carpeting, upholstered furniture and drapery from the home is ideal. Horizontal blinds should be eliminated in the rooms where the person spends the most time (bedroom, study, television room). Washable vinyl, roller-type shades are optimal.  6. Remove all non-washable stuffed toys from the bedroom. Wash stuffed toys weekly like sheets and blankets above.  7. Reduce indoor humidity to less than 50%. Inexpensive humidity monitors can be purchased at most hardware stores. Do not use a humidifier as can make the problem worse and are not recommended.

## 2021-10-01 ENCOUNTER — Ambulatory Visit: Payer: Self-pay

## 2021-10-01 ENCOUNTER — Ambulatory Visit (INDEPENDENT_AMBULATORY_CARE_PROVIDER_SITE_OTHER): Payer: BC Managed Care – PPO | Admitting: Family Medicine

## 2021-10-01 ENCOUNTER — Encounter: Payer: Self-pay | Admitting: Family Medicine

## 2021-10-01 ENCOUNTER — Other Ambulatory Visit: Payer: Self-pay

## 2021-10-01 VITALS — BP 118/76 | HR 84 | Temp 98.5°F | Resp 18 | Wt 179.1 lb

## 2021-10-01 DIAGNOSIS — R053 Chronic cough: Secondary | ICD-10-CM | POA: Diagnosis not present

## 2021-10-01 DIAGNOSIS — H1013 Acute atopic conjunctivitis, bilateral: Secondary | ICD-10-CM | POA: Diagnosis not present

## 2021-10-01 DIAGNOSIS — J3089 Other allergic rhinitis: Secondary | ICD-10-CM

## 2021-10-01 DIAGNOSIS — J309 Allergic rhinitis, unspecified: Secondary | ICD-10-CM

## 2021-10-01 MED ORDER — EPINEPHRINE 0.3 MG/0.3ML IJ SOAJ: INTRAMUSCULAR | 1 refills | Status: DC

## 2021-11-17 ENCOUNTER — Ambulatory Visit (INDEPENDENT_AMBULATORY_CARE_PROVIDER_SITE_OTHER): Payer: BC Managed Care – PPO

## 2021-11-17 DIAGNOSIS — J309 Allergic rhinitis, unspecified: Secondary | ICD-10-CM

## 2021-11-24 ENCOUNTER — Ambulatory Visit (INDEPENDENT_AMBULATORY_CARE_PROVIDER_SITE_OTHER): Payer: BC Managed Care – PPO

## 2021-11-24 DIAGNOSIS — J309 Allergic rhinitis, unspecified: Secondary | ICD-10-CM | POA: Diagnosis not present

## 2021-12-08 ENCOUNTER — Ambulatory Visit (INDEPENDENT_AMBULATORY_CARE_PROVIDER_SITE_OTHER): Payer: BC Managed Care – PPO

## 2021-12-08 DIAGNOSIS — J309 Allergic rhinitis, unspecified: Secondary | ICD-10-CM | POA: Diagnosis not present

## 2021-12-17 ENCOUNTER — Ambulatory Visit (INDEPENDENT_AMBULATORY_CARE_PROVIDER_SITE_OTHER): Payer: BC Managed Care – PPO

## 2021-12-17 DIAGNOSIS — J309 Allergic rhinitis, unspecified: Secondary | ICD-10-CM

## 2021-12-23 ENCOUNTER — Ambulatory Visit (INDEPENDENT_AMBULATORY_CARE_PROVIDER_SITE_OTHER): Payer: BC Managed Care – PPO

## 2021-12-23 DIAGNOSIS — J309 Allergic rhinitis, unspecified: Secondary | ICD-10-CM | POA: Diagnosis not present

## 2022-01-13 ENCOUNTER — Ambulatory Visit (INDEPENDENT_AMBULATORY_CARE_PROVIDER_SITE_OTHER): Payer: BC Managed Care – PPO

## 2022-01-13 DIAGNOSIS — J309 Allergic rhinitis, unspecified: Secondary | ICD-10-CM | POA: Diagnosis not present

## 2022-02-02 ENCOUNTER — Ambulatory Visit (INDEPENDENT_AMBULATORY_CARE_PROVIDER_SITE_OTHER): Payer: BC Managed Care – PPO

## 2022-02-02 DIAGNOSIS — J309 Allergic rhinitis, unspecified: Secondary | ICD-10-CM | POA: Diagnosis not present

## 2022-03-17 ENCOUNTER — Ambulatory Visit (INDEPENDENT_AMBULATORY_CARE_PROVIDER_SITE_OTHER): Payer: BC Managed Care – PPO

## 2022-03-17 DIAGNOSIS — J309 Allergic rhinitis, unspecified: Secondary | ICD-10-CM

## 2022-04-04 DIAGNOSIS — J3089 Other allergic rhinitis: Secondary | ICD-10-CM | POA: Diagnosis not present

## 2022-04-04 NOTE — Progress Notes (Signed)
VIALS EXP 04-05-23 ?

## 2022-04-13 ENCOUNTER — Ambulatory Visit (INDEPENDENT_AMBULATORY_CARE_PROVIDER_SITE_OTHER): Payer: BC Managed Care – PPO

## 2022-04-13 DIAGNOSIS — J309 Allergic rhinitis, unspecified: Secondary | ICD-10-CM

## 2022-05-11 ENCOUNTER — Ambulatory Visit (INDEPENDENT_AMBULATORY_CARE_PROVIDER_SITE_OTHER): Payer: BC Managed Care – PPO

## 2022-05-11 DIAGNOSIS — J309 Allergic rhinitis, unspecified: Secondary | ICD-10-CM | POA: Diagnosis not present

## 2022-06-16 ENCOUNTER — Ambulatory Visit (INDEPENDENT_AMBULATORY_CARE_PROVIDER_SITE_OTHER): Payer: BC Managed Care – PPO

## 2022-06-16 DIAGNOSIS — J309 Allergic rhinitis, unspecified: Secondary | ICD-10-CM

## 2022-06-22 ENCOUNTER — Ambulatory Visit (INDEPENDENT_AMBULATORY_CARE_PROVIDER_SITE_OTHER): Payer: BC Managed Care – PPO | Admitting: *Deleted

## 2022-06-22 DIAGNOSIS — J309 Allergic rhinitis, unspecified: Secondary | ICD-10-CM | POA: Diagnosis not present

## 2022-07-07 ENCOUNTER — Ambulatory Visit (INDEPENDENT_AMBULATORY_CARE_PROVIDER_SITE_OTHER): Payer: BC Managed Care – PPO

## 2022-07-07 DIAGNOSIS — J309 Allergic rhinitis, unspecified: Secondary | ICD-10-CM

## 2022-07-14 ENCOUNTER — Ambulatory Visit (INDEPENDENT_AMBULATORY_CARE_PROVIDER_SITE_OTHER): Payer: BC Managed Care – PPO

## 2022-07-14 DIAGNOSIS — J309 Allergic rhinitis, unspecified: Secondary | ICD-10-CM | POA: Diagnosis not present

## 2022-07-21 ENCOUNTER — Ambulatory Visit (INDEPENDENT_AMBULATORY_CARE_PROVIDER_SITE_OTHER): Payer: BC Managed Care – PPO

## 2022-07-21 DIAGNOSIS — J309 Allergic rhinitis, unspecified: Secondary | ICD-10-CM | POA: Diagnosis not present

## 2022-08-03 ENCOUNTER — Ambulatory Visit (INDEPENDENT_AMBULATORY_CARE_PROVIDER_SITE_OTHER): Payer: BC Managed Care – PPO | Admitting: *Deleted

## 2022-08-03 DIAGNOSIS — J309 Allergic rhinitis, unspecified: Secondary | ICD-10-CM | POA: Diagnosis not present

## 2022-08-10 ENCOUNTER — Ambulatory Visit (INDEPENDENT_AMBULATORY_CARE_PROVIDER_SITE_OTHER): Payer: BC Managed Care – PPO | Admitting: *Deleted

## 2022-08-10 DIAGNOSIS — J309 Allergic rhinitis, unspecified: Secondary | ICD-10-CM

## 2022-09-21 ENCOUNTER — Ambulatory Visit (INDEPENDENT_AMBULATORY_CARE_PROVIDER_SITE_OTHER): Payer: BC Managed Care – PPO | Admitting: *Deleted

## 2022-09-21 DIAGNOSIS — J309 Allergic rhinitis, unspecified: Secondary | ICD-10-CM | POA: Diagnosis not present

## 2022-10-11 ENCOUNTER — Ambulatory Visit (INDEPENDENT_AMBULATORY_CARE_PROVIDER_SITE_OTHER): Payer: BC Managed Care – PPO

## 2022-10-11 DIAGNOSIS — J309 Allergic rhinitis, unspecified: Secondary | ICD-10-CM

## 2022-11-07 ENCOUNTER — Ambulatory Visit (INDEPENDENT_AMBULATORY_CARE_PROVIDER_SITE_OTHER): Payer: BC Managed Care – PPO

## 2022-11-07 DIAGNOSIS — J309 Allergic rhinitis, unspecified: Secondary | ICD-10-CM

## 2022-11-30 DIAGNOSIS — J3089 Other allergic rhinitis: Secondary | ICD-10-CM | POA: Diagnosis not present

## 2022-11-30 NOTE — Progress Notes (Signed)
VIALS EXP 12-01-23 

## 2022-12-14 ENCOUNTER — Ambulatory Visit (INDEPENDENT_AMBULATORY_CARE_PROVIDER_SITE_OTHER): Payer: BC Managed Care – PPO

## 2022-12-14 DIAGNOSIS — J309 Allergic rhinitis, unspecified: Secondary | ICD-10-CM

## 2022-12-26 NOTE — Progress Notes (Unsigned)
FOLLOW UP Date of Service/Encounter:  12/28/22   Subjective:  Brenda Figueroa (DOB: 1996/02/16) is a 27 y.o. female who returns to the [facility] on 12/28/2022 in re-evaluation of the following: allergic rhinitis on allergen immunotherapy allergic conjunctivitis, and cough  History obtained from: chart review and patient and mother.  For Review, LV was on 10/01/21  with Gareth Morgan, FNP seen for routine follow-up. She reported improvement in her allergic rhinitis symptoms since starting AIT. Last skin testing in 2017-positive to panel with exception of cockroach.  AIT started in 2017. She is currently coming every 4 weeks in red vial at maintenance dose of 0.5 mL. She reached 0.5 mL of red vial in September 2018.  Today presents for follow-up. Doing great with AIT. No concerns or reactions. She is taking a generic OTC AH daily.  Her nose is dry if not using nasal spray.  She does have flonase and saline spray. She does not use daily. They would like to continue allergy injections at every 4 months as she is still symptomatic, but has improved significantly since prior to starting.  They do not need refills.  Last injection 01/10, due in 2 weeks.  Allergies as of 12/28/2022       Reactions   Doxycycline Rash        Medication List        Accurate as of December 28, 2022 12:52 PM. If you have any questions, ask your nurse or doctor.          ascorbic acid 100 MG tablet Commonly known as: VITAMIN C Take by mouth.   azelastine 0.1 % nasal spray Commonly known as: ASTELIN Place 2 sprays into both nostrils 2 (two) times daily as needed for rhinitis. Use in each nostril as directed   Azelastine-Fluticasone 137-50 MCG/ACT Susp PLACE 2 SPRAYS INTO BOTH NOSTRILS AS DIRECTED   cetirizine 10 MG tablet Commonly known as: ZYRTEC Take by mouth.   cyanocobalamin 500 MCG tablet Commonly known as: VITAMIN B12 Take 500 mcg by mouth daily.   EPINEPHrine 0.3 mg/0.3  mL Soaj injection Commonly known as: EpiPen 2-Pak USE AS DIRECTED FOR LIFE THREATENING ALLERGIC REACTIONS   fish oil-omega-3 fatty acids 1000 MG capsule Take by mouth.   fluticasone 50 MCG/ACT nasal spray Commonly known as: Flonase Place 2 sprays into both nostrils daily as needed for allergies or rhinitis.   levocetirizine 5 MG tablet Commonly known as: XYZAL TAKE 1 TABLET BY MOUTH ONCE DAILY IF NEEDED   levothyroxine 88 MCG tablet Commonly known as: SYNTHROID Take 88 mcg by mouth daily.   levothyroxine 100 MCG tablet Commonly known as: SYNTHROID Take 100 mcg by mouth daily before breakfast.   melatonin 5 MG Tabs Take 1 tablet by mouth at bedtime.   multivitamin capsule Take by mouth.   multivitamin with minerals tablet Take by mouth. Reported on 02/23/2016   Olopatadine HCl 0.7 % Soln Commonly known as: P[place]o Place 1 drop into both eyes daily as needed.   pyridOXINE 25 MG tablet Commonly known as: VITAMIN B6 Take by mouth. Reported on 02/23/2016   sodium chloride 0.65 % nasal spray Commonly known as: OCEAN Place into the nose.   triamcinolone cream 0.1 % Commonly known as: KENALOG PLEASE SEE ATTACHED FOR DETAILED DIRECTIONS       Past Medical History:  Diagnosis Date   Allergic rhinitis 2000   Past Surgical History:  Procedure Laterality Date   CARDIAC SURGERY Bilateral    Otherwise,  there have been no changes to her past medical history, surgical history, family history, or social history.  ROS: All others negative except as noted per HPI.   Objective:  BP 110/68   Pulse (!) 53   Temp 98.1 F (36.7 C) (Temporal)   Resp 16   Ht 4\' 9"  (1.448 m)   Wt 148 lb 3.2 oz (67.2 kg)   SpO2 99%   BMI 32.07 kg/m  Body mass index is 32.07 kg/m. Physical Exam: General Appearance:  Alert, cooperative, no distress, appears stated age  Head:  Normocephalic, without obvious abnormality, atraumatic  Eyes:  Conjunctiva clear, EOM's intact  Nose: Nares  normal, hypertrophic turbinates, normal mucosa, and no visible anterior polyps  Throat: Lips, tongue normal; teeth and gums normal, normal posterior oropharynx  Neck: Supple, symmetrical  Lungs:   clear to auscultation bilaterally, Respirations unlabored, no coughing  Heart:  regular rate and rhythm and no murmur, Appears well perfused  Extremities: No edema  Skin: Skin color, texture, turgor normal, no rashes or lesions on visualized portions of skin    Assessment/Plan   Allergic rhinitis-controlled Continue allergen avoidance measures directed toward pollens, mold, and dust mite, dog and cat as listed below Continue an antihistamine once a day as needed for runny nose or itch. Continue Flonase 1 spray in each nostril once a day as needed for stuffy nose. Consider saline nasal rinses as needed for nasal symptoms. Use this before any medicated nasal sprays for best result Continue allergen immunotherapy once a month and have access to an epinephrine autoinjector sent Let us know if you would like to retest for environmental allergies. This will give [place] some guidance about when to stop allergy immunotherapy  Allergic conjunctivitis-controlled Some over the counter eye drops include Pataday one drop in each eye once a day as needed for red, itchy eyes OR Zaditor one drop in each eye twice a day as needed for red itchy eyes.  Call the clinic if this treatment plan is not working well for you  Follow up in 1 year or sooner if needed.  Sigurd Sos, MD  [facility]

## 2022-12-28 ENCOUNTER — Encounter: Payer: Self-pay | Admitting: Internal Medicine

## 2022-12-28 ENCOUNTER — Ambulatory Visit (INDEPENDENT_AMBULATORY_CARE_PROVIDER_SITE_OTHER): Payer: BC Managed Care – PPO | Admitting: Internal Medicine

## 2022-12-28 ENCOUNTER — Other Ambulatory Visit: Payer: Self-pay

## 2022-12-28 VITALS — BP 110/68 | HR 53 | Temp 98.1°F | Resp 16 | Ht <= 58 in | Wt 148.2 lb

## 2022-12-28 DIAGNOSIS — H1013 Acute atopic conjunctivitis, bilateral: Secondary | ICD-10-CM | POA: Diagnosis not present

## 2022-12-28 DIAGNOSIS — J3089 Other allergic rhinitis: Secondary | ICD-10-CM | POA: Diagnosis not present

## 2022-12-28 MED ORDER — EPINEPHRINE 0.3 MG/0.3ML IJ SOAJ: INTRAMUSCULAR | 1 refills | Status: AC

## 2022-12-28 NOTE — Patient Instructions (Addendum)
Allergic rhinitis Continue allergen avoidance measures directed toward pollens, mold, and dust mite, dog and cat as listed below Continue an antihistamine once a day as needed for runny nose or itch. Continue Flonase 1 spray in each nostril once a day as needed for stuffy nose. Consider saline nasal rinses as needed for nasal symptoms. Use this before any medicated nasal sprays for best result Continue allergen immunotherapy once a month and have access to an epinephrine autoinjector sent Let us know if you would like to retest for environmental allergies. This will give [place] some guidance about when to stop allergy immunotherapy  Allergic conjunctivitis Some over the counter eye drops include Pataday one drop in each eye once a day as needed for red, itchy eyes OR Zaditor one drop in each eye twice a day as needed for red itchy eyes.  Call the clinic if this treatment plan is not working well for you  Follow up in 1 year or sooner if needed.  Reducing Pollen Exposure The American Academy of Allergy, Asthma and Immunology suggests the following steps to reduce your exposure to pollen during allergy seasons. Do not hang sheets or clothing out to dry; pollen may collect on these items. Do not mow lawns or spend time around freshly cut grass; mowing stirs up pollen. Keep windows closed at night.  Keep car windows closed while driving. Minimize morning activities outdoors, a time when pollen counts are usually at their highest. Stay indoors as much as possible when pollen counts or humidity is high and on windy days when pollen tends to remain in the air longer. Use air conditioning when possible.  Many air conditioners have filters that trap the pollen spores. Use a HEPA room air filter to remove pollen form the indoor air you breathe.  Control of Mold Allergen Mold and fungi can grow on a variety of surfaces provided certain temperature and moisture conditions exist.  Outdoor molds grow on  plants, decaying vegetation and soil.  The major outdoor mold, Alternaria and Cladosporium, are found in very high numbers during hot and dry conditions.  Generally, a late Summer - Fall peak is seen for common outdoor fungal spores.  Rain will temporarily lower outdoor mold spore count, but counts rise rapidly when the rainy period ends.  The most important indoor molds are Aspergillus and Penicillium.  Dark, humid and poorly ventilated basements are ideal sites for mold growth.  The next most common sites of mold growth are the bathroom and the kitchen.  Outdoor [employer] Use air conditioning and keep windows closed Avoid exposure to decaying vegetation. Avoid leaf raking. Avoid grain handling. Consider wearing a face mask if working in moldy areas.  Indoor Mold Control Maintain humidity below 50%. Clean washable surfaces with 5% bleach solution. Remove sources e.g. Contaminated carpets.   Control of Dog or Cat Allergen Avoidance is the best way to manage a dog or cat allergy. If you have a dog or cat and are allergic to dog or cats, consider removing the dog or cat from the home. If you have a dog or cat but don't want to find it a new home, or if your family wants a pet even though someone in the household is allergic, here are some strategies that may help keep symptoms at bay:  Keep the pet out of your bedroom and restrict it to only a few rooms. Be advised that keeping the dog or cat in only one room will not limit the allergens  to that room. Don't pet, hug or kiss the dog or cat; if you do, wash your hands with soap and water. High-efficiency particulate air (HEPA) cleaners run continuously in a bedroom or living room can reduce allergen levels over time. Regular use of a high-efficiency vacuum cleaner or a central vacuum can reduce allergen levels. Giving your dog or cat a bath at least once a week can reduce airborne allergen.   Control of Dust Mite Allergen Dust mites play a  major role in allergic asthma and rhinitis. They occur in environments with high humidity wherever human skin is found. Dust mites absorb humidity from the atmosphere (ie, they do not drink) and feed on organic matter (including shed human and animal skin). Dust mites are a microscopic type of insect that you cannot see with the naked eye. High levels of dust mites have been detected from mattresses, pillows, carpets, upholstered furniture, bed covers, clothes, soft toys and any woven material. The principal allergen of the dust mite is found in its feces. A gram of dust may contain 1,000 mites and 250,000 fecal particles. Mite antigen is easily measured in the air during house cleaning activities. Dust mites do not bite and do not cause harm to humans, other than by triggering allergies/asthma.  Ways to decrease your exposure to dust mites in your home:  1. Encase mattresses, box springs and pillows with a mite-impermeable barrier or cover  2. Wash sheets, blankets and drapes weekly in hot water (130 F) with detergent and dry them in a dryer on the hot setting.  3. Have the room cleaned frequently with a vacuum cleaner and a damp dust-mop. For carpeting or rugs, vacuuming with a vacuum cleaner equipped with a high-efficiency particulate air (HEPA) filter. The dust mite allergic individual should not be in a room which is being cleaned and should wait 1 hour after cleaning before going into the room.  4. Do not sleep on upholstered furniture (eg, couches).  5. If possible removing carpeting, upholstered furniture and drapery from the home is ideal. Horizontal blinds should be eliminated in the rooms where the person spends the most time (bedroom, study, television room). Washable vinyl, roller-type shades are optimal.  6. Remove all non-washable stuffed toys from the bedroom. Wash stuffed toys weekly like sheets and blankets above.  7. Reduce indoor humidity to less than 50%. Inexpensive humidity  monitors can be purchased at most hardware stores. Do not use a humidifier as can make the problem worse and are not recommended.

## 2023-02-01 ENCOUNTER — Ambulatory Visit (INDEPENDENT_AMBULATORY_CARE_PROVIDER_SITE_OTHER): Payer: BC Managed Care – PPO

## 2023-02-01 DIAGNOSIS — J309 Allergic rhinitis, unspecified: Secondary | ICD-10-CM

## 2023-02-15 ENCOUNTER — Ambulatory Visit (INDEPENDENT_AMBULATORY_CARE_PROVIDER_SITE_OTHER): Payer: BC Managed Care – PPO

## 2023-02-15 DIAGNOSIS — J309 Allergic rhinitis, unspecified: Secondary | ICD-10-CM

## 2023-02-22 ENCOUNTER — Ambulatory Visit (INDEPENDENT_AMBULATORY_CARE_PROVIDER_SITE_OTHER): Payer: BC Managed Care – PPO

## 2023-02-22 DIAGNOSIS — J309 Allergic rhinitis, unspecified: Secondary | ICD-10-CM | POA: Diagnosis not present

## 2023-02-28 ENCOUNTER — Ambulatory Visit (INDEPENDENT_AMBULATORY_CARE_PROVIDER_SITE_OTHER): Payer: BC Managed Care – PPO

## 2023-02-28 DIAGNOSIS — J309 Allergic rhinitis, unspecified: Secondary | ICD-10-CM | POA: Diagnosis not present

## 2023-03-07 ENCOUNTER — Ambulatory Visit (INDEPENDENT_AMBULATORY_CARE_PROVIDER_SITE_OTHER): Payer: BC Managed Care – PPO

## 2023-03-07 DIAGNOSIS — J309 Allergic rhinitis, unspecified: Secondary | ICD-10-CM

## 2023-03-14 ENCOUNTER — Ambulatory Visit (INDEPENDENT_AMBULATORY_CARE_PROVIDER_SITE_OTHER): Payer: BC Managed Care – PPO

## 2023-03-14 DIAGNOSIS — J309 Allergic rhinitis, unspecified: Secondary | ICD-10-CM

## 2023-03-27 ENCOUNTER — Ambulatory Visit (INDEPENDENT_AMBULATORY_CARE_PROVIDER_SITE_OTHER): Payer: BC Managed Care – PPO

## 2023-03-27 DIAGNOSIS — J309 Allergic rhinitis, unspecified: Secondary | ICD-10-CM | POA: Diagnosis not present

## 2023-04-25 ENCOUNTER — Ambulatory Visit (INDEPENDENT_AMBULATORY_CARE_PROVIDER_SITE_OTHER): Payer: BC Managed Care – PPO

## 2023-04-25 DIAGNOSIS — J309 Allergic rhinitis, unspecified: Secondary | ICD-10-CM | POA: Diagnosis not present

## 2023-05-18 ENCOUNTER — Ambulatory Visit (INDEPENDENT_AMBULATORY_CARE_PROVIDER_SITE_OTHER): Payer: BC Managed Care – PPO

## 2023-05-18 DIAGNOSIS — J309 Allergic rhinitis, unspecified: Secondary | ICD-10-CM | POA: Diagnosis not present

## 2023-06-14 ENCOUNTER — Ambulatory Visit (INDEPENDENT_AMBULATORY_CARE_PROVIDER_SITE_OTHER): Payer: BC Managed Care – PPO

## 2023-06-14 DIAGNOSIS — J309 Allergic rhinitis, unspecified: Secondary | ICD-10-CM

## 2023-07-12 ENCOUNTER — Ambulatory Visit: Payer: Self-pay

## 2023-07-12 DIAGNOSIS — J309 Allergic rhinitis, unspecified: Secondary | ICD-10-CM

## 2023-08-09 ENCOUNTER — Ambulatory Visit (INDEPENDENT_AMBULATORY_CARE_PROVIDER_SITE_OTHER): Payer: BC Managed Care – PPO | Admitting: *Deleted

## 2023-08-09 DIAGNOSIS — J309 Allergic rhinitis, unspecified: Secondary | ICD-10-CM

## 2023-08-14 NOTE — Progress Notes (Signed)
VIALS EXP 08-13-24

## 2023-08-15 DIAGNOSIS — J3081 Allergic rhinitis due to animal (cat) (dog) hair and dander: Secondary | ICD-10-CM | POA: Diagnosis not present

## 2023-09-13 ENCOUNTER — Ambulatory Visit (INDEPENDENT_AMBULATORY_CARE_PROVIDER_SITE_OTHER): Payer: Self-pay | Admitting: *Deleted

## 2023-09-13 DIAGNOSIS — J309 Allergic rhinitis, unspecified: Secondary | ICD-10-CM

## 2023-10-30 ENCOUNTER — Ambulatory Visit (INDEPENDENT_AMBULATORY_CARE_PROVIDER_SITE_OTHER): Payer: BC Managed Care – PPO | Admitting: *Deleted

## 2023-10-30 DIAGNOSIS — J309 Allergic rhinitis, unspecified: Secondary | ICD-10-CM | POA: Diagnosis not present

## 2023-10-30 MED ORDER — EPINEPHRINE 0.3 MG/0.3ML IJ SOAJ: 0.3000 mg | INTRAMUSCULAR | 1 refills | Status: AC | PRN

## 2023-11-03 ENCOUNTER — Other Ambulatory Visit (HOSPITAL_COMMUNITY): Payer: Self-pay

## 2023-11-07 ENCOUNTER — Other Ambulatory Visit (HOSPITAL_COMMUNITY): Payer: Self-pay

## 2023-11-07 ENCOUNTER — Telehealth: Payer: Self-pay

## 2023-11-07 NOTE — Telephone Encounter (Signed)
*  Asthma/Allergy  Pharmacy Patient Advocate Encounter   Received notification from CoverMyMeds that prior authorization for EpiPen 2-Pak 0.3MG /0.3ML auto-injectors  is required/requested.   Insurance verification completed.   The patient is insured through [place] .   Per test claim: The current 30 day co-pay is, $4.00.  No PA needed at this time. This test claim was processed through [facility]- copay amounts may vary at other pharmacies due to pharmacy/plan contracts, or as the patient moves through the different stages of their insurance plan.

## 2023-11-08 ENCOUNTER — Ambulatory Visit (INDEPENDENT_AMBULATORY_CARE_PROVIDER_SITE_OTHER): Payer: BC Managed Care – PPO

## 2023-11-08 DIAGNOSIS — J309 Allergic rhinitis, unspecified: Secondary | ICD-10-CM | POA: Diagnosis not present

## 2023-11-15 ENCOUNTER — Ambulatory Visit (INDEPENDENT_AMBULATORY_CARE_PROVIDER_SITE_OTHER): Payer: Self-pay

## 2023-11-15 DIAGNOSIS — J309 Allergic rhinitis, unspecified: Secondary | ICD-10-CM | POA: Diagnosis not present

## 2023-12-13 ENCOUNTER — Ambulatory Visit (INDEPENDENT_AMBULATORY_CARE_PROVIDER_SITE_OTHER): Payer: BC Managed Care – PPO | Admitting: *Deleted

## 2023-12-13 DIAGNOSIS — J309 Allergic rhinitis, unspecified: Secondary | ICD-10-CM | POA: Diagnosis not present

## 2024-01-17 ENCOUNTER — Ambulatory Visit (INDEPENDENT_AMBULATORY_CARE_PROVIDER_SITE_OTHER): Payer: Self-pay

## 2024-01-17 DIAGNOSIS — J309 Allergic rhinitis, unspecified: Secondary | ICD-10-CM

## 2024-02-14 ENCOUNTER — Ambulatory Visit (INDEPENDENT_AMBULATORY_CARE_PROVIDER_SITE_OTHER): Payer: Self-pay | Admitting: *Deleted

## 2024-02-14 DIAGNOSIS — J309 Allergic rhinitis, unspecified: Secondary | ICD-10-CM

## 2024-03-06 ENCOUNTER — Ambulatory Visit (INDEPENDENT_AMBULATORY_CARE_PROVIDER_SITE_OTHER): Payer: Self-pay | Admitting: *Deleted

## 2024-03-06 DIAGNOSIS — J309 Allergic rhinitis, unspecified: Secondary | ICD-10-CM

## 2024-06-26 ENCOUNTER — Ambulatory Visit

## 2024-07-03 ENCOUNTER — Encounter: Payer: Self-pay | Admitting: Allergy

## 2024-07-03 ENCOUNTER — Ambulatory Visit (INDEPENDENT_AMBULATORY_CARE_PROVIDER_SITE_OTHER): Admitting: Allergy

## 2024-07-03 ENCOUNTER — Other Ambulatory Visit: Payer: Self-pay

## 2024-07-03 VITALS — BP 118/78 | HR 60 | Temp 98.7°F | Resp 18 | Ht 60.0 in | Wt 153.9 lb

## 2024-07-03 DIAGNOSIS — H1013 Acute atopic conjunctivitis, bilateral: Secondary | ICD-10-CM

## 2024-07-03 DIAGNOSIS — J3089 Other allergic rhinitis: Secondary | ICD-10-CM | POA: Diagnosis not present

## 2024-07-03 NOTE — Progress Notes (Signed)
 Follow-up Note  RE: Brenda Figueroa MRN: [id number] DOB: [birth year] Date of Office Visit: 07/03/2024   History of present illness: Brenda Figueroa is a 28 y.o. female presenting today for follow-up of allergic rhinitis with conjunctivitis.  She presents today with a family friend.  She was last seen in the office on 12/28/2022 by Dr. Marinda. Discussed the use of AI scribe software for clinical note transcription with the patient, who gave verbal consent to proceed.  She has been receiving allergy shots since 2017. Her last allergy shot was in April, and she has not received any since then due to being in [place] .  Her allergy symptoms have improved since she has been on allergy shots.  She does not report that her allergy symptoms have worsened over the past several months that she has been off of them. She takes Zyrtec every morning and uses Flonase  daily, which have helped control her symptoms. If she misses a dose of her medication, she experiences symptoms such as rhinorrhea.  She has not developed any new medical issues, surgeries, or hospitalizations in the past year, and she has experienced weight loss through dietary changes and exercise. She has been staying with active family members in [place] , which has contributed to her weight loss.     She does plan to move to [facility] area in the next couple of weeks.  She is interested in identifying an allergist there to see if she may need to continue on her allergy shots.   Review of systems: 10pt ROS negative unless noted above in HPI  Past medical/social/surgical/family history have been reviewed and are unchanged unless specifically indicated below.  No changes  Medication List: Current Outpatient Medications  Medication Sig Dispense Refill   ascorbic acid (VITAMIN C) 100 MG tablet Take by mouth.     azelastine  (ASTELIN ) 0.1 % nasal spray Place 2 sprays into both nostrils 2 (two) times daily as needed for rhinitis. Use  in each nostril as directed 30 mL 5   cetirizine (ZYRTEC) 10 MG tablet Take by mouth.     EPINEPHrine  (EPIPEN  2-PAK) 0.3 mg/0.3 mL IJ SOAJ injection USE AS DIRECTED FOR LIFE THREATENING ALLERGIC REACTIONS 2 each 1   EPINEPHrine  (EPIPEN  2-PAK) 0.3 mg/0.3 mL IJ SOAJ injection Inject 0.3 mg into the muscle as needed for anaphylaxis. 0.3 mL 1   fish oil-omega-3 fatty acids 1000 MG capsule Take by mouth.     fluticasone  (FLONASE ) 50 MCG/ACT nasal spray Place 2 sprays into both nostrils daily as needed for allergies or rhinitis. 1 g 5   levocetirizine (XYZAL ) 5 MG tablet TAKE 1 TABLET BY MOUTH ONCE DAILY IF NEEDED 30 tablet 1   levothyroxine (SYNTHROID) 88 MCG tablet Take 88 mcg by mouth daily.     levothyroxine (SYNTHROID, LEVOTHROID) 100 MCG tablet Take 100 mcg by mouth daily before breakfast.     melatonin 5 MG TABS Take 1 tablet by mouth at bedtime.     Multiple Vitamin (MULTIVITAMIN) capsule Take by mouth.     Multiple Vitamins-Minerals (MULTIVITAMIN WITH MINERALS) tablet Take by mouth. Reported on 02/23/2016     Olopatadine  HCl (PAZEO) 0.7 % SOLN Place 1 drop into both eyes daily as needed. 1 Bottle 5   pyridOXINE (VITAMIN B-6) 25 MG tablet Take by mouth. Reported on 02/23/2016     sodium chloride (OCEAN) 0.65 % nasal spray Place into the nose.     triamcinolone cream (KENALOG) 0.1 % PLEASE SEE ATTACHED FOR DETAILED DIRECTIONS  vitamin B-12 (CYANOCOBALAMIN) 500 MCG tablet Take 500 mcg by mouth daily.     Azelastine -Fluticasone  137-50 MCG/ACT SUSP PLACE 2 SPRAYS INTO BOTH NOSTRILS AS DIRECTED (Patient not taking: Reported on 07/03/2024) 23 g 2   No current facility-administered medications for this visit.     Known medication allergies: Allergies  Allergen Reactions   Doxycycline Rash     Physical examination: Blood pressure 118/78, pulse 60, temperature 98.7 F (37.1 C), temperature source Temporal, resp. rate 18, height 5' (1.524 m), weight 153 lb 14.4 oz (69.8 kg), SpO2  98%.  General: Alert, interactive, in no acute distress. HEENT: PERRLA, TMs pearly gray, turbinates non-edematous without discharge, post-pharynx non erythematous. Neck: Supple without lymphadenopathy. Lungs: Clear to auscultation without wheezing, rhonchi or rales. {no increased work of breathing. CV: Normal S1, S2 without murmurs. Abdomen: Nondistended, nontender. Skin: Warm and dry, without lesions or rashes. Extremities:  No clubbing, cyanosis or edema. Neuro:   Grossly intact.  Diagnostics/Labs: None today  Assessment and plan:   Allergic rhinitis Continue allergen avoidance measures directed toward pollens, mold, and dust mite, dog and cat as listed below Continue an antihistamine (Zyrtec) once a day as needed for runny nose or itch. Continue Flonase  1 spray in each nostril once a day as needed for stuffy nose. Consider saline nasal rinses as needed for nasal symptoms. Use this before any medicated nasal sprays for best result You had done >5 years of allergy shots with us .  You have had improvement in symptoms with allergy shots.  You have about 3-4 shots left in your current vials.  They will expire 08/13/24.   My recommendation is you graduate from our allergy shots here.  You can establish allergy care in [place]  and they can update allergy testing there and see if you may benefit from doing allergy shots while there.    Allergic conjunctivitis Some over the counter eye drops include Pataday  one drop in each eye once a day as needed for red, itchy eyes OR Zaditor one drop in each eye twice a day as needed for red itchy eyes.  Will help provide names/contacts of local allergist in [facility]  I appreciate the opportunity to take part in Brenda Figueroa's care. Please do not hesitate to contact me with questions.  Sincerely,   Danita Brain, MD Allergy/Immunology [facility]

## 2024-07-03 NOTE — Patient Instructions (Addendum)
 Allergic rhinitis Continue allergen avoidance measures directed toward pollens, mold, and dust mite, dog and cat as listed below Continue an antihistamine (Zyrtec) once a day as needed for runny nose or itch. Continue Flonase  1 spray in each nostril once a day as needed for stuffy nose. Consider saline nasal rinses as needed for nasal symptoms. Use this before any medicated nasal sprays for best result You had done >5 years of allergy shots with us .  You have had improvement in symptoms with allergy shots.  You have about 3-4 shots left in your current vials.  They will expire 08/13/24.   My recommendation is you graduate from our allergy shots here.  You can establish allergy care in [place]  and they can update allergy testing there and see if you may benefit from doing allergy shots while there.    Allergic conjunctivitis Some over the counter eye drops include Pataday  one drop in each eye once a day as needed for red, itchy eyes OR Zaditor one drop in each eye twice a day as needed for red itchy eyes.  Will help provide names/contacts of local allergist in [facility]  Reducing Pollen Exposure The American Academy of Allergy, Asthma and Immunology suggests the following steps to reduce your exposure to pollen during allergy seasons. Do not hang sheets or clothing out to dry; pollen may collect on these items. Do not mow lawns or spend time around freshly cut grass; mowing stirs up pollen. Keep windows closed at night.  Keep car windows closed while driving. Minimize morning activities outdoors, a time when pollen counts are usually at their highest. Stay indoors as much as possible when pollen counts or humidity is high and on windy days when pollen tends to remain in the air longer. Use air conditioning when possible.  Many air conditioners have filters that trap the pollen spores. Use a HEPA room air filter to remove pollen form the indoor air you breathe.  Control of Mold Allergen Mold  and fungi can grow on a variety of surfaces provided certain temperature and moisture conditions exist.  Outdoor molds grow on plants, decaying vegetation and soil.  The major outdoor mold, Alternaria and Cladosporium, are found in very high numbers during hot and dry conditions.  Generally, a late Summer - Fall peak is seen for common outdoor fungal spores.  Rain will temporarily lower outdoor mold spore count, but counts rise rapidly when the rainy period ends.  The most important indoor molds are Aspergillus and Penicillium.  Dark, humid and poorly ventilated basements are ideal sites for mold growth.  The next most common sites of mold growth are the bathroom and the kitchen.  Outdoor [employer] Use air conditioning and keep windows closed Avoid exposure to decaying vegetation. Avoid leaf raking. Avoid grain handling. Consider wearing a face mask if working in moldy areas.  Indoor Mold Control Maintain humidity below 50%. Clean washable surfaces with 5% bleach solution. Remove sources e.g. Contaminated carpets.   Control of Dog or Cat Allergen Avoidance is the best way to manage a dog or cat allergy. If you have a dog or cat and are allergic to dog or cats, consider removing the dog or cat from the home. If you have a dog or cat but don't want to find it a new home, or if your family wants a pet even though someone in the household is allergic, here are some strategies that may help keep symptoms at bay:  Keep the pet out of your  bedroom and restrict it to only a few rooms. Be advised that keeping the dog or cat in only one room will not limit the allergens to that room. Don't pet, hug or kiss the dog or cat; if you do, wash your hands with soap and water. High-efficiency particulate air (HEPA) cleaners run continuously in a bedroom or living room can reduce allergen levels over time. Regular use of a high-efficiency vacuum cleaner or a central vacuum can reduce allergen levels. Giving  your dog or cat a bath at least once a week can reduce airborne allergen.   Control of Dust Mite Allergen Dust mites play a major role in allergic asthma and rhinitis. They occur in environments with high humidity wherever human skin is found. Dust mites absorb humidity from the atmosphere (ie, they do not drink) and feed on organic matter (including shed human and animal skin). Dust mites are a microscopic type of insect that you cannot see with the naked eye. High levels of dust mites have been detected from mattresses, pillows, carpets, upholstered furniture, bed covers, clothes, soft toys and any woven material. The principal allergen of the dust mite is found in its feces. A gram of dust may contain 1,000 mites and 250,000 fecal particles. Mite antigen is easily measured in the air during house cleaning activities. Dust mites do not bite and do not cause harm to humans, other than by triggering allergies/asthma.  Ways to decrease your exposure to dust mites in your home:  1. Encase mattresses, box springs and pillows with a mite-impermeable barrier or cover  2. Wash sheets, blankets and drapes weekly in hot water (130 F) with detergent and dry them in a dryer on the hot setting.  3. Have the room cleaned frequently with a vacuum cleaner and a damp dust-mop. For carpeting or rugs, vacuuming with a vacuum cleaner equipped with a high-efficiency particulate air (HEPA) filter. The dust mite allergic individual should not be in a room which is being cleaned and should wait 1 hour after cleaning before going into the room.  4. Do not sleep on upholstered furniture (eg, couches).  5. If possible removing carpeting, upholstered furniture and drapery from the home is ideal. Horizontal blinds should be eliminated in the rooms where the person spends the most time (bedroom, study, television room). Washable vinyl, roller-type shades are optimal.  6. Remove all non-washable stuffed toys from the bedroom.  Wash stuffed toys weekly like sheets and blankets above.  7. Reduce indoor humidity to less than 50%. Inexpensive humidity monitors can be purchased at most hardware stores. Do not use a humidifier as can make the problem worse and are not recommended.

## 2024-07-05 ENCOUNTER — Telehealth: Payer: Self-pay

## 2024-07-05 NOTE — Telephone Encounter (Signed)
 Patient called and states that during the last visit you mentioned you would help provide names/contacts of local allergist in [place].   She was wondering if you had that information available and if it could be mailed out to her.

## 2024-07-08 NOTE — Telephone Encounter (Signed)
 I called the patient but the patient's mother answered. I verified name/DPR Brenda Figueroa. I informed she is on the [facility]. Informed of Dr.Padgett's message and informed allergist list has been placed to be mailed out. I verified mailing address.
